# Patient Record
Sex: Male | Born: 1975 | Race: Black or African American | Hispanic: No | Marital: Single
Health system: Southern US, Community
[De-identification: ages and names within clinical notes are randomized; demographics above are authoritative.]

## PROBLEM LIST (undated history)

## (undated) DIAGNOSIS — M549 Dorsalgia, unspecified: Secondary | ICD-10-CM

## (undated) DIAGNOSIS — I10 Essential (primary) hypertension: Secondary | ICD-10-CM

## (undated) DIAGNOSIS — K429 Umbilical hernia without obstruction or gangrene: Secondary | ICD-10-CM

## (undated) HISTORY — PX: VASECTOMY: SHX75

## (undated) HISTORY — DX: Dorsalgia, unspecified: M54.9

## (undated) HISTORY — DX: Umbilical hernia without obstruction or gangrene: K42.9

## (undated) HISTORY — DX: Essential (primary) hypertension: I10

---

## 2006-05-19 ENCOUNTER — Ambulatory Visit: Payer: Self-pay | Admitting: Internal Medicine

## 2006-05-19 LAB — CONVERTED CEMR LAB
AST: 19 units/L (ref 0–37)
Albumin: 4.4 g/dL (ref 3.5–5.2)
Alkaline Phosphatase: 50 units/L (ref 39–117)
BUN: 10 mg/dL (ref 6–23)
Basophils Absolute: 0.1 10*3/uL (ref 0.0–0.1)
Basophils Relative: 0.8 % (ref 0.0–1.0)
CO2: 29 meq/L (ref 19–32)
Chloride: 105 meq/L (ref 96–112)
Cholesterol: 181 mg/dL (ref 0–200)
Creatinine, Ser: 0.9 mg/dL (ref 0.4–1.5)
HCT: 48.1 % (ref 39.0–52.0)
Hemoglobin: 16.2 g/dL (ref 13.0–17.0)
LDL Cholesterol: 112 mg/dL — ABNORMAL HIGH (ref 0–99)
MCHC: 33.5 g/dL (ref 30.0–36.0)
Monocytes Relative: 7.1 % (ref 3.0–11.0)
Neutrophils Relative %: 67.6 % (ref 43.0–77.0)
RBC: 5.03 M/uL (ref 4.22–5.81)
RDW: 12.1 % (ref 11.5–14.6)
Total Bilirubin: 1.4 mg/dL — ABNORMAL HIGH (ref 0.3–1.2)
Total CHOL/HDL Ratio: 3.9
Total Protein: 6.9 g/dL (ref 6.0–8.3)
VLDL: 22 mg/dL (ref 0–40)

## 2006-08-20 DIAGNOSIS — I10 Essential (primary) hypertension: Secondary | ICD-10-CM | POA: Insufficient documentation

## 2008-10-23 ENCOUNTER — Ambulatory Visit: Payer: Self-pay | Admitting: Internal Medicine

## 2008-10-26 ENCOUNTER — Encounter (INDEPENDENT_AMBULATORY_CARE_PROVIDER_SITE_OTHER): Payer: Self-pay | Admitting: *Deleted

## 2008-11-13 ENCOUNTER — Ambulatory Visit: Payer: Self-pay | Admitting: Internal Medicine

## 2009-01-25 ENCOUNTER — Ambulatory Visit: Payer: Self-pay | Admitting: Internal Medicine

## 2009-07-23 ENCOUNTER — Ambulatory Visit: Payer: Self-pay | Admitting: Internal Medicine

## 2009-07-23 DIAGNOSIS — F17200 Nicotine dependence, unspecified, uncomplicated: Secondary | ICD-10-CM | POA: Insufficient documentation

## 2009-07-23 HISTORY — DX: Nicotine dependence, unspecified, uncomplicated: F17.200

## 2010-01-22 ENCOUNTER — Ambulatory Visit: Payer: Self-pay | Admitting: Internal Medicine

## 2010-01-28 LAB — CONVERTED CEMR LAB
AST: 20 units/L (ref 0–37)
Alkaline Phosphatase: 49 units/L (ref 39–117)
Basophils Absolute: 0 10*3/uL (ref 0.0–0.1)
Calcium: 9.5 mg/dL (ref 8.4–10.5)
Direct LDL: 149.9 mg/dL
GFR calc non Af Amer: 115.41 mL/min (ref >60)
Glucose, Bld: 99 mg/dL (ref 70–99)
Hemoglobin: 15.5 g/dL (ref 13.0–17.0)
Lymphocytes Relative: 24 % (ref 12.0–46.0)
Monocytes Relative: 7.4 % (ref 3.0–12.0)
Neutro Abs: 4.3 10*3/uL (ref 1.4–7.7)
Platelets: 247 10*3/uL (ref 150.0–400.0)
RDW: 12.5 % (ref 11.5–14.6)
Sodium: 139 meq/L (ref 135–145)
TSH: 1.39 microintl units/mL (ref 0.35–5.50)
Total Bilirubin: 1.2 mg/dL (ref 0.3–1.2)
Total CHOL/HDL Ratio: 5
VLDL: 29.6 mg/dL (ref 0.0–40.0)

## 2010-04-28 LAB — CONVERTED CEMR LAB
BUN: 11 mg/dL (ref 6–23)
CO2: 28 meq/L (ref 19–32)
Chloride: 104 meq/L (ref 96–112)
Eosinophils Absolute: 0.1 10*3/uL (ref 0.0–0.7)
Eosinophils Relative: 0.9 % (ref 0.0–5.0)
Glucose, Bld: 99 mg/dL (ref 70–99)
HCT: 46.9 % (ref 39.0–52.0)
Lymphs Abs: 1.6 10*3/uL (ref 0.7–4.0)
MCHC: 34.4 g/dL (ref 30.0–36.0)
MCV: 96.1 fL (ref 78.0–100.0)
Monocytes Absolute: 0.2 10*3/uL (ref 0.1–1.0)
Platelets: 273 10*3/uL (ref 150.0–400.0)
Potassium: 4.4 meq/L (ref 3.5–5.1)
RDW: 11.9 % (ref 11.5–14.6)

## 2010-05-02 NOTE — Assessment & Plan Note (Signed)
Summary: cpx//pt will be fasting//lch   Vital Signs:  Patient profile:   35 year old male Height:      74 inches Weight:      222 pounds Pulse rate:   80 / minute Pulse rhythm:   regular BP sitting:   126 / 84  (left arm) Cuff size:   large  Vitals Entered By: Army Fossa CMA (January 22, 2010 8:37 AM) CC: CPX, fasting Comments discuss BP meds (only been takin once a day) declines flu shot  [facility] [address]    History of Present Illness: CPX very hard for him to take BP meds twice a day, ambulatory BPs  normal  Preventive Screening-Counseling & Management  Caffeine-Diet-Exercise     Does Patient Exercise: yes  Current Medications (verified): 1)  Carvedilol 12.5 Mg Tabs (Carvedilol) .Marland Kitchen.. 1 By Mouth Two Times A Day 2)  Naprosyn 500 Mg Tabs (Naproxen) .... One By Mouth Twice A Day As Needed For Pain  Allergies (verified): No Known Drug Allergies  Past History:  Past Medical History: Reviewed history from 01/25/2009 and no changes required. Hypertension   Past Surgical History: Vasectomy (05/2009)  Family History: M - hyperthyroid PVD-- mom, on coumadin, GI vascular insuf. CAD - MGF DM - PGM  HTN - M mini-stroke - F  colon Ca - no prostate Ca - no Uterine ca-- M  Social History: Occupation: paints cars Married 2 children Alcohol use- yes, daily  3- 4 beers  tobbaco- used to smoke 1.5 ppd --- now 1ppd  exercise-- active at work coaching T-ball at [employer] Patient Exercise:  yes  Review of Systems General:  Denies fatigue, fever, and weight loss. CV:  Denies chest pain or discomfort and swelling of feet. Resp:  Denies cough, coughing up blood, sputum productive, and wheezing. GI:  Denies bloody stools, diarrhea, nausea, and vomiting. Psych:  Denies anxiety and depression.  Physical Exam  General:  alert, well-developed, and well-nourished.   Neck:  no thyromegaly Lungs:  normal respiratory effort, no intercostal retractions, and no  accessory muscle use.  no wheezing, few rhonchi with cough only Heart:  normal rate, regular rhythm, no murmur, and no gallop.   Abdomen:  soft, non-tender, no distention, no masses, no guarding, and no rigidity.   Extremities:  no lower extremity edema Neurologic:  alert & oriented X3, strength normal in all extremities, and gait normal.   Psych:  Oriented X3, memory intact for recent and remote, normally interactive, good eye contact, not anxious appearing, not depressed appearing, and not agitated.     Impression & Recommendations:  Problem # 1:  ROUTINE GENERAL MEDICAL EXAM@HEALTH  CARE FACL (ICD-V70.0) Td 09-2008 flu shot-- declined , benefits discussed   diet and exercise discussed  the patient is a heavy smoker, he is also exposed to fumes at work. Counseled about tobacco risks. Information provided about quitting.  He also needs to see a dentist routinely do to his history of tobacco abuse  Reminded patient to drink in moderation, one or 2 drinks a day is acceptable  Orders: Venipuncture (14782) TLB-BMP (Basic Metabolic Panel-BMET) (80048-METABOL) TLB-CBC Platelet - w/Differential (85025-CBCD) TLB-Hepatic/Liver Function Pnl (80076-HEPATIC) TLB-Lipid Panel (80061-LIPID) TLB-TSH (Thyroid Stimulating Hormone) (84443-TSH) Specimen Handling (95621)  Problem # 2:  HYPERTENSION (ICD-401.9) hard  for him to take carvedilol twice a day, we agreed to take it once a day in the morning, check ambulatory BPs, see instructions   If BP not well controlled we'll switch to the extended release  carvedilol His updated medication list for this problem includes:    Carvedilol 12.5 Mg Tabs (Carvedilol) .Marland Kitchen... 1 by mouth two times a day  Complete Medication List: 1)  Carvedilol 12.5 Mg Tabs (Carvedilol) .Marland Kitchen.. 1 by mouth two times a day 2)  Naprosyn 500 Mg Tabs (Naproxen) .... One by mouth twice a day as needed for pain  Patient Instructions: 1)  okay to take carvedilol only in the morning 2)   Check her BPs at night and early in the morning from time to time 3)  If the BPs are more than 140/85 consistently, let me know 4)  It is not healthy for men to drink more then 2  drinks per day 5)  Please schedule a follow-up appointment in 1 year.  Prescriptions: CARVEDILOL 12.5 MG TABS (CARVEDILOL) 1 by mouth two times a day  #180 x 3   Entered by:   Army Fossa CMA   Authorized by:   Nolon Rod. Paz MD   Signed by:   Army Fossa CMA on 01/22/2010   Method used:   Electronically to        [facility] [phone]* (retail)       [address]       [place], [place]  [address]       Ph: [phone]       Fax: [phone]   RxID:   [id number]    Orders Added: 1)  Venipuncture [27253] 2)  TLB-BMP (Basic Metabolic Panel-BMET) [80048-METABOL] 3)  TLB-CBC Platelet - w/Differential [85025-CBCD] 4)  TLB-Hepatic/Liver Function Pnl [80076-HEPATIC] 5)  TLB-Lipid Panel [80061-LIPID] 6)  TLB-TSH (Thyroid Stimulating Hormone) [84443-TSH] 7)  Specimen Handling [99000] 8)  Est. Patient age 42-39 [97]       Risk Factors:  Alcohol use:  yes Exercise:  yes

## 2010-05-02 NOTE — Assessment & Plan Note (Signed)
Summary: 6 mth fu/ns/kdc   Vital Signs:  Patient profile:   35 year old male Height:      74 inches Weight:      216.6 pounds BMI:     27.91 Pulse rate:   70 / minute BP sitting:   122 / 80  Vitals Entered By: Shary Decamp (July 23, 2009 8:42 AM) CC: rov, not fasting   History of Present Illness: ROV doing well ambulatory BPs normal except for a couple of times just had a vasectomy, still hurting  smoking less (see SH)  Current Medications (verified): 1)  Carvedilol 12.5 Mg Tabs (Carvedilol) .Marland Kitchen.. 1 By Mouth Two Times A Day 2)  Naprosyn 500 Mg Tabs (Naproxen) .... One By Mouth Twice A Day As Needed For Pain  Allergies (verified): No Known Drug Allergies  Past History:  Past Medical History: Reviewed history from 01/25/2009 and no changes required. Hypertension   Past Surgical History: Denies surgical history Vasectomy (05/2009)  Social History: Occupation: Mining engineer cars Married 2 children Alcohol use- yes, daily 4 beers "at least" tobbaco- used to smoke 1.5 ppd --- now 1ppd or less  exercise-- active at work  Review of Systems CV:  Denies chest pain or discomfort and swelling of feet. Resp:  Denies cough and shortness of breath.  Physical Exam  General:  alert, well-developed, and well-nourished.   Lungs:  normal respiratory effort, no intercostal retractions, no accessory muscle use, and normal breath sounds.   Heart:  normal rate, regular rhythm, and no murmur.   Extremities:  no pretibial edema bilaterally    Impression & Recommendations:  Problem # 1:  HYPERTENSION (ICD-401.9) at goal  encouraged diet-exercise wife has a BP monitor, will ask patient to check his BP  His updated medication list for this problem includes:    Carvedilol 12.5 Mg Tabs (Carvedilol) .Marland Kitchen... 1 by mouth two times a day  Problem # 2:  TOBACCO ABUSE (ICD-305.1) smoking less, praised  rec. to  quit completely  Complete Medication List: 1)  Carvedilol 12.5 Mg Tabs  (Carvedilol) .Marland Kitchen.. 1 by mouth two times a day 2)  Naprosyn 500 Mg Tabs (Naproxen) .... One by mouth twice a day as needed for pain  Patient Instructions: 1)  Please schedule a follow-up appointment in 6 months (physical-- fasting) 2)  Check your blood pressure 2 or 3 times a week. If it is more than 140/85 consistently,please let us know

## 2012-09-06 ENCOUNTER — Ambulatory Visit (INDEPENDENT_AMBULATORY_CARE_PROVIDER_SITE_OTHER): Payer: Self-pay | Admitting: Internal Medicine

## 2012-09-06 ENCOUNTER — Encounter: Payer: Self-pay | Admitting: Internal Medicine

## 2012-09-06 VITALS — BP 150/100 | HR 85 | Temp 98.0°F | Wt 230.0 lb

## 2012-09-06 DIAGNOSIS — K429 Umbilical hernia without obstruction or gangrene: Secondary | ICD-10-CM

## 2012-09-06 DIAGNOSIS — I10 Essential (primary) hypertension: Secondary | ICD-10-CM

## 2012-09-06 MED ORDER — LOSARTAN POTASSIUM 50 MG PO TABS: 50.0000 mg | ORAL_TABLET | Freq: Every day | ORAL | Status: DC

## 2012-09-06 NOTE — Progress Notes (Signed)
  Subjective:    Patient ID: Trevor Warren, male    DOB: 1976/02/11, 37 y.o.   MRN: [id number]  HPI Last visit in 2011, concerned about his blood pressure. Hypertension, took carvedilol for a while, see assessment and plan for details. Also complains of for umbilical hernia, known to have it for years, it has been tender every time somebody push it in. Not getting bigger or  worse lately.   Past Medical History  Diagnosis Date  . Hypertension   . Back pain     on-off   Past Surgical History  Procedure Laterality Date  . Vasectomy     History   Social History  . Marital Status: Single    Spouse Name: N/A    Number of Children: 2  . Years of Education: N/A   Occupational History  . paint cars    Social History Main Topics  . Smoking status: Heavy Tobacco Smoker  . Smokeless tobacco: Never Used     Comment: 1 ppd   . Alcohol Use: Yes     Comment: daily , 3-4 a day  . Drug Use: No  . Sexually Active: Not on file   Other Topics Concern  . Not on file   Social History Narrative   Married   2 children            Family History: M - hyperthyroid PVD-- mom, on coumadin, GI vascular insuf. CAD - MGF DM - PGM  HTN - M mini-stroke - F  colon Ca - no prostate Ca - no Uterine ca-- M  Review of Systems Denies chest pain or shortness or breath No nausea, vomiting, diarrhea. No lower extremity edema Rarely take naproxen or Motrin. Diabetes improving, he usually skips breakfast, eats fast food at least daily.    Objective:   Physical Exam BP 150/100  Pulse 85  Temp(Src) 98 F (36.7 C) (Oral)  Wt 230 lb (104.327 kg)  BMI 29.52 kg/m2  SpO2 97%\  General -- alert, well-developed, nad  Neck --no thyromegaly  Lungs -- normal respiratory effort, no intercostal retractions, no accessory muscle use, and normal breath sounds.   Heart-- normal rate, regular rhythm, no murmur, and no gallop.   Abdomen--  No bruit, good bowel sounds.  Has a small umbilical hernia,  seems he has  a small amount of fatty tissue that is not completely reducible. Quite tender when I attempted to push the fatty tissue. Extremities-- no pretibial edema bilaterally  Neurologic-- alert & oriented X3 and strength normal in all extremities. Psych-- Cognition and judgment appear intact. Alert and cooperative with normal attention span and concentration.  not anxious appearing and not depressed appearing.       Assessment & Plan:

## 2012-09-06 NOTE — Patient Instructions (Addendum)
Take losartan 1 tablet daily Check the  blood pressure 2 or 3 times a week, be sure it is between 110/60 and 140/85. If it is consistently higher or lower, let me know Labs today and labs in 3 weeks (BMP dx HTN) Next visit 2 months, fasting, physical exam    Sodium-Controlled Diet Sodium is a mineral. It is found in many foods. Sodium may be found naturally or added during the making of a food. The most common form of sodium is salt, which is made up of sodium and chloride. Reducing your sodium intake involves changing your eating habits. The following guidelines will help you reduce the sodium in your diet:  Stop using the salt shaker.  Use salt sparingly in cooking and baking.  Substitute with sodium-free seasonings and spices.  Do not use a salt substitute (potassium chloride) without your caregiver's permission.  Include a variety of fresh, unprocessed foods in your diet.  Limit the use of processed and convenience foods that are high in sodium. USE THE FOLLOWING FOODS SPARINGLY: Breads/Starches  Commercial bread stuffing, commercial pancake or waffle mixes, coating mixes. Waffles. Croutons. Prepared (boxed or frozen) potato, rice, or noodle mixes that contain salt or sodium. Salted [place] fries or hash browns. Salted popcorn, breads, crackers, chips, or snack foods. Vegetables  Vegetables canned with salt or prepared in cream, butter, or cheese sauces. Sauerkraut. Tomato or vegetable juices canned with salt.  Fresh vegetables are allowed if rinsed thoroughly. Fruit  Fruit is okay to eat. Meat and Meat Substitutes  Salted or smoked meats, such as bacon or Canadian bacon, chipped or corned beef, hot dogs, salt pork, luncheon meats, pastrami, ham, or sausage. Canned or smoked fish, poultry, or meat. Processed cheese or cheese spreads, blue or Roquefort cheese. Battered or frozen fish products. Prepared spaghetti sauce. Baked beans. Reuben sandwiches. Salted nuts.  Caviar. Milk  Limit buttermilk to 1 cup per week. Soups and Combination Foods  Bouillon cubes, canned or dried soups, broth, consomm. Convenience (frozen or packaged) dinners with more than 600 mg sodium. Pot pies, pizza, Asian food, fast food cheeseburgers, and specialty sandwiches. Desserts and Sweets  Regular (salted) desserts, pie, commercial fruit snack pies, commercial snack cakes, canned puddings.  Eat desserts and sweets in moderation. Fats and Oils  Gravy mixes or canned gravy. No more than 1 to 2 tbs of salad dressing. Chip dips.  Eat fats and oils in moderation. Beverages  See those listed under the vegetables and milk groups. Condiments  Ketchup, mustard, meat sauces, salsa, regular (salted) and lite soy sauce or mustard. Dill pickles, olives, meat tenderizer. Prepared horseradish or pickle relish. Dutch-processed cocoa. Baking powder or baking soda used medicinally. Worcestershire sauce. "Light" salt. Salt substitute, unless approved by your caregiver. Document Released: 09/06/2001 Document Revised: 06/09/2011 Document Reviewed: 04/09/2009 [facility] Patient Information 2014 [place], [place].

## 2012-09-06 NOTE — Assessment & Plan Note (Addendum)
History of hypertension since at least 2008, last seen in 2011; he took carvedilol twice a day and felt weak, then he took  once a day and felt okay although he does not recall any ambulatory BPs. eventually ran out of medications   more than 2 years. BP today is elevated. Risk of elevated including strokes, CAD, CHF, CRI discussed. Plan:  labs Low-salt and a healthier diet discussed Start losartan Self BP check. See instructions.

## 2012-09-06 NOTE — Assessment & Plan Note (Signed)
Small but quite tender to palpation umbilical hernia. Recommend surgical referral for consideration of surgery versus observation. Patient will call if the area gets worse.

## 2012-09-07 ENCOUNTER — Telehealth: Payer: Self-pay | Admitting: Internal Medicine

## 2012-09-07 LAB — CBC WITH DIFFERENTIAL/PLATELET
Basophils Absolute: 0 10*3/uL (ref 0.0–0.1)
Eosinophils Relative: 0.8 % (ref 0.0–5.0)
HCT: 46.1 % (ref 39.0–52.0)
Lymphs Abs: 2.4 10*3/uL (ref 0.7–4.0)
MCV: 95.5 fl (ref 78.0–100.0)
Monocytes Absolute: 0.1 10*3/uL (ref 0.1–1.0)
Platelets: 274 10*3/uL (ref 150.0–400.0)
RDW: 12.4 % (ref 11.5–14.6)

## 2012-09-07 LAB — COMPREHENSIVE METABOLIC PANEL
ALT: 23 U/L (ref 0–53)
Alkaline Phosphatase: 58 U/L (ref 39–117)
Sodium: 140 mEq/L (ref 135–145)
Total Bilirubin: 1.2 mg/dL (ref 0.3–1.2)
Total Protein: 7.7 g/dL (ref 6.0–8.3)

## 2012-09-07 LAB — TSH: TSH: 1.16 u[IU]/mL (ref 0.35–5.50)

## 2012-09-07 MED ORDER — TRAMADOL HCL 50 MG PO TABS: 50.0000 mg | ORAL_TABLET | Freq: Three times a day (TID) | ORAL | Status: DC | PRN

## 2012-09-07 NOTE — Telephone Encounter (Signed)
Patient called stating that he woke up this morning with hernia pain after yesterday's visit. He would like to know if he can get pain meds sent to [facility] on [address].

## 2012-09-07 NOTE — Telephone Encounter (Signed)
Ultram 50 mg one by mouth 3 times a day when necessary #30, no RF .may cause drowsiness

## 2012-09-07 NOTE — Telephone Encounter (Signed)
Please advise 

## 2012-09-07 NOTE — Telephone Encounter (Signed)
Refill done.pt made aware.  

## 2012-09-08 ENCOUNTER — Encounter: Payer: Self-pay | Admitting: *Deleted

## 2012-09-21 ENCOUNTER — Ambulatory Visit (INDEPENDENT_AMBULATORY_CARE_PROVIDER_SITE_OTHER): Payer: Self-pay | Admitting: Surgery

## 2012-09-24 ENCOUNTER — Encounter: Payer: Self-pay | Admitting: Lab

## 2012-09-27 ENCOUNTER — Other Ambulatory Visit (INDEPENDENT_AMBULATORY_CARE_PROVIDER_SITE_OTHER): Payer: 59

## 2012-09-27 DIAGNOSIS — I10 Essential (primary) hypertension: Secondary | ICD-10-CM

## 2012-09-27 LAB — BASIC METABOLIC PANEL
Calcium: 9.7 mg/dL (ref 8.4–10.5)
GFR: 99.45 mL/min (ref >60.00)
Sodium: 136 mEq/L (ref 135–145)

## 2012-09-27 NOTE — Addendum Note (Signed)
Addended by: Silvio Pate D on: 09/27/2012 01:29 PM   Modules accepted: Orders

## 2012-09-28 ENCOUNTER — Ambulatory Visit (INDEPENDENT_AMBULATORY_CARE_PROVIDER_SITE_OTHER): Payer: 59 | Admitting: Surgery

## 2012-09-28 ENCOUNTER — Encounter (INDEPENDENT_AMBULATORY_CARE_PROVIDER_SITE_OTHER): Payer: Self-pay | Admitting: Surgery

## 2012-09-28 VITALS — BP 152/100 | HR 72 | Resp 14 | Ht 74.0 in | Wt 234.8 lb

## 2012-09-28 DIAGNOSIS — E669 Obesity, unspecified: Secondary | ICD-10-CM | POA: Insufficient documentation

## 2012-09-28 DIAGNOSIS — K429 Umbilical hernia without obstruction or gangrene: Secondary | ICD-10-CM

## 2012-09-28 NOTE — Progress Notes (Signed)
Subjective:     Patient ID: Trevor Warren, male   DOB: 09/18/1975, 37 y.o.   MRN: [id number]  HPI  Trevor Warren  11-05-1975 [id number]  Patient Care Team: Wanda Plump, MD as PCP - General  This patient is a 37 y.o.male who presents today for surgical evaluation at the request of Dr. Drue Novel.   Reason for visit: Umbilical hernia  Active male.  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  Has noticed a lump at his bellybutton for years.  It has gotten more sensitive and slightly larger.  Came to his primary care doctor.  Concern for hernia.  Was able to get it reduced but very tender.  No more sensitive.  He continues to smoke.  Can walk 30 minutes without difficulty.  No problems with constipation or diarrhea.  He had a vasectomy.  No abdominal or other issues.  No postprandial nausea or vomiting.  No episodes of obstruction.  Pain can be sharp and surprises him.  Patient Active Problem List   Diagnosis Date Noted  . Umbilical hernia 09/06/2012  . TOBACCO ABUSE 07/23/2009  . HYPERTENSION 08/20/2006    Past Medical History  Diagnosis Date  . Hypertension   . Back pain     on-off    Past Surgical History  Procedure Laterality Date  . Vasectomy    . Vasectomy      History   Social History  . Marital Status: Married    Spouse Name: N/A    Number of Children: 2  . Years of Education: N/A   Occupational History  . paint cars    Social History Main Topics  . Smoking status: Heavy Tobacco Smoker  . Smokeless tobacco: Never Used     Comment: 1 ppd   . Alcohol Use: Yes     Comment: daily , 3-4 a day  . Drug Use: No  . Sexually Active: Not on file   Other Topics Concern  . Not on file   Social History Narrative   Married   2 children            History reviewed. No pertinent family history.  Current Outpatient Prescriptions  Medication Sig Dispense Refill  . losartan (COZAAR) 50 MG tablet Take 1 tablet (50 mg total) by mouth daily.  30 tablet  1  . traMADol (ULTRAM) 50  MG tablet Take 1 tablet (50 mg total) by mouth 3 (three) times daily as needed for pain.  30 tablet  0   No current facility-administered medications for this visit.     No Known Allergies  BP 152/100  Pulse 72  Resp 14  Ht 6\' 2"  (1.88 m)  Wt 234 lb 12.8 oz (106.505 kg)  BMI 30.13 kg/m2  No results found.   Review of Systems  Constitutional: Negative for fever, chills and diaphoresis.  HENT: Negative for nosebleeds, sore throat, facial swelling, mouth sores, trouble swallowing and ear discharge.   Eyes: Negative for photophobia, discharge and visual disturbance.  Respiratory: Negative for choking, chest tightness, shortness of breath and stridor.   Cardiovascular: Negative for chest pain and palpitations.  Gastrointestinal: Negative for nausea, vomiting, abdominal pain, diarrhea, constipation, blood in stool, abdominal distention, anal bleeding and rectal pain.  Endocrine: Negative for cold intolerance and heat intolerance.  Genitourinary: Negative for dysuria, urgency, difficulty urinating and testicular pain.  Musculoskeletal: Negative for myalgias, back pain, arthralgias and gait problem.  Skin: Negative for color change, pallor, rash and wound.  Allergic/Immunologic: Negative for environmental allergies and food allergies.  Neurological: Negative for dizziness, speech difficulty, weakness, numbness and headaches.  Hematological: Negative for adenopathy. Does not bruise/bleed easily.  Psychiatric/Behavioral: Negative for hallucinations, confusion and agitation.       Objective:   Physical Exam  Constitutional: He is oriented to person, place, and time. He appears well-developed and well-nourished. No distress.  Muscular build  HENT:  Head: Normocephalic.  Mouth/Throat: Oropharynx is clear and moist. No oropharyngeal exudate.  Eyes: Conjunctivae and EOM are normal. Pupils are equal, round, and reactive to light. No scleral icterus.  Neck: Normal range of motion. Neck  supple. No tracheal deviation present.  Cardiovascular: Normal rate, regular rhythm and intact distal pulses.   Pulmonary/Chest: Effort normal and breath sounds normal. No respiratory distress.  Abdominal: Soft. He exhibits no distension. There is no tenderness. A hernia is present. Hernia confirmed positive in the ventral area. Hernia confirmed negative in the right inguinal area and confirmed negative in the left inguinal area.    Musculoskeletal: Normal range of motion. He exhibits no tenderness.  Lymphadenopathy:    He has no cervical adenopathy.       Right: No inguinal adenopathy present.       Left: No inguinal adenopathy present.  Neurological: He is alert and oriented to person, place, and time. No cranial nerve deficit. He exhibits normal muscle tone. Coordination normal.  Skin: Skin is warm and dry. No rash noted. He is not diaphoretic. No erythema. No pallor.  Psychiatric: He has a normal mood and affect. His behavior is normal. Judgment and thought content normal.       Assessment:     Small but very symptomatic umbilical hernia in an active obese male     Plan:     While it is small, it is very symptomatic.  I worry about only get worse given his young age and intense lifting needs at work.  I recommended repair.  I would lean towards a laparoscopic underlay repair with mesh to minimize recurrence.  He is interested in proceeding:  The anatomy & physiology of the abdominal wall was discussed.  The pathophysiology of hernias was discussed.  Natural history risks without surgery including progeressive enlargement, pain, incarceration & strangulation was discussed.   Contributors to complications such as smoking, obesity, diabetes, prior surgery, etc were discussed.   I feel the risks of no intervention will lead to serious problems that outweigh the operative risks; therefore, I recommended surgery to reduce and repair the hernia.  I explained laparoscopic techniques with  possible need for an open approach.  I noted the probable use of mesh to patch and/or buttress the hernia repair  Risks such as bleeding, infection, abscess, need for further treatment, heart attack, death, and other risks were discussed.  I noted a good likelihood this will help address the problem.   Goals of post-operative recovery were discussed as well.  Possibility that this will not correct all symptoms was explained.  I stressed the importance of low-impact activity, aggressive pain control, avoiding constipation, & not pushing through pain to minimize risk of post-operative chronic pain or injury. Possibility of reherniation especially with smoking, obesity, diabetes, immunosuppression, and other health conditions was discussed.  We will work to minimize complications.     An educational handout further explaining the pathology & treatment options was given as well.  Questions were answered.  The patient expresses understanding & wishes to proceed with surgery.

## 2012-09-28 NOTE — Patient Instructions (Signed)
See the Handout(s) we gave you.  Consider surgery.  Please call our office at [phone] if you wish to schedule surgery or if you have further questions / concerns.   Hernia A hernia occurs when an internal organ pushes out through a weak spot in the abdominal wall. Hernias most commonly occur in the groin and around the navel. Hernias often can be pushed back into place (reduced). Most hernias tend to get worse over time. Some abdominal hernias can get stuck in the opening (irreducible or incarcerated hernia) and cannot be reduced. An irreducible abdominal hernia which is tightly squeezed into the opening is at risk for impaired blood supply (strangulated hernia). A strangulated hernia is a medical emergency. Because of the risk for an irreducible or strangulated hernia, surgery may be recommended to repair a hernia. CAUSES   Heavy lifting.  Prolonged coughing.  Straining to have a bowel movement.  A cut (incision) made during an abdominal surgery. HOME CARE INSTRUCTIONS   Bed rest is not required. You may continue your normal activities.  Avoid lifting more than 10 pounds (4.5 kg) or straining.  Cough gently. If you are a smoker it is best to stop. Even the best hernia repair can break down with the continual strain of coughing. Even if you do not have your hernia repaired, a cough will continue to aggravate the problem.  Do not wear anything tight over your hernia. Do not try to keep it in with an outside bandage or truss. These can damage abdominal contents if they are trapped within the hernia sac.  Eat a normal diet.  Avoid constipation. Straining over long periods of time will increase hernia size and encourage breakdown of repairs. If you cannot do this with diet alone, stool softeners may be used. SEEK IMMEDIATE MEDICAL CARE IF:   You have a fever.  You develop increasing abdominal pain.  You feel nauseous or vomit.  Your hernia is stuck outside the abdomen, looks  discolored, feels hard, or is tender.  You have any changes in your bowel habits or in the hernia that are unusual for you.  You have increased pain or swelling around the hernia.  You cannot push the hernia back in place by applying gentle pressure while lying down. MAKE SURE YOU:   Understand these instructions.  Will watch your condition.  Will get help right away if you are not doing well or get worse. Document Released: 03/17/2005 Document Revised: 06/09/2011 Document Reviewed: 11/04/2007 [facility] Patient Information 2014 [place].  HERNIA REPAIR: POST OP INSTRUCTIONS  1. DIET: Follow a light bland diet the first 24 hours after arrival home, such as soup, liquids, crackers, etc.  Be sure to include lots of fluids daily.  Avoid fast food or heavy meals as your are more likely to get nauseated.  Eat a low fat the next few days after surgery. 2. Take your usually prescribed home medications unless otherwise directed. 3. PAIN CONTROL: a. Pain is best controlled by a usual combination of three different methods TOGETHER: i. Ice/Heat ii. Over the counter pain medication iii. Prescription pain medication b. Most patients will experience some swelling and bruising around the hernia(s) such as the bellybutton, groins, or old incisions.  Ice packs or heating pads (30-60 minutes up to 6 times a day) will help. Use ice for the first few days to help decrease swelling and bruising, then switch to heat to help relax tight/sore spots and speed recovery.  Some people prefer to  use ice alone, heat alone, alternating between ice & heat.  Experiment to what works for you.  Swelling and bruising can take several weeks to resolve.   c. It is helpful to take an over-the-counter pain medication regularly for the first few weeks.  Choose one of the following that works best for you: i. Naproxen (Aleve, etc)  Two 220mg tabs twice a day ii. Ibuprofen (Advil, etc) Three 200mg tabs four times a day  (every meal & bedtime) iii. Acetaminophen (Tylenol, etc) 325-650mg four times a day (every meal & bedtime) d. A  prescription for pain medication should be given to you upon discharge.  Take your pain medication as prescribed.  i. If you are having problems/concerns with the prescription medicine (does not control pain, nausea, vomiting, rash, itching, etc), please call us [phone] to see if we need to switch you to a different pain medicine that will work better for you and/or control your side effect better. ii. If you need a refill on your pain medication, please contact your pharmacy.  They will contact our office to request authorization. Prescriptions will not be filled after 5 pm or on week-ends. 4. Avoid getting constipated.  Between the surgery and the pain medications, it is common to experience some constipation.  Increasing fluid intake and taking a fiber supplement (such as Metamucil, Citrucel, FiberCon, MiraLax, etc) 1-2 times a day regularly will usually help prevent this problem from occurring.  A mild laxative (prune juice, Milk of Magnesia, MiraLax, etc) should be taken according to package directions if there are no bowel movements after 48 hours.   5. Wash / shower every day.  You may shower over the dressings as they are waterproof.   6. Remove your waterproof bandages 5 days after surgery.  You may leave the incision open to air.  You may replace a dressing/Band-Aid to cover the incision for comfort if you wish.  Continue to shower over incision(s) after the dressing is off.    7. ACTIVITIES as tolerated:   a. You may resume regular (light) daily activities beginning the next day-such as daily self-care, walking, climbing stairs-gradually increasing activities as tolerated.  If you can walk 30 minutes without difficulty, it is safe to try more intense activity such as jogging, treadmill, bicycling, low-impact aerobics, swimming, etc. b. Save the most intensive and strenuous  activity for last such as sit-ups, heavy lifting, contact sports, etc  Refrain from any heavy lifting or straining until you are off narcotics for pain control.   c. DO NOT PUSH THROUGH PAIN.  Let pain be your guide: If it hurts to do something, don't do it.  Pain is your body warning you to avoid that activity for another week until the pain goes down. d. You may drive when you are no longer taking prescription pain medication, you can comfortably wear a seatbelt, and you can safely maneuver your car and apply brakes. e. You may have sexual intercourse when it is comfortable.  8. FOLLOW UP in our office a. Please call [facility] at [phone] to set up an appointment to see your surgeon in the office for a follow-up appointment approximately 2-3 weeks after your surgery. b. Make sure that you call for this appointment the day you arrive home to insure a convenient appointment time. 9.  IF YOU HAVE DISABILITY OR FAMILY LEAVE FORMS, BRING THEM TO THE OFFICE FOR PROCESSING.  DO NOT GIVE THEM TO YOUR DOCTOR.  WHEN TO CALL US (  [phone]: 1. Poor pain control 2. Reactions / problems with new medications (rash/itching, nausea, etc)  3. Fever over 101.5 F (38.5 C) 4. Inability to urinate 5. Nausea and/or vomiting 6. Worsening swelling or bruising 7. Continued bleeding from incision. 8. Increased pain, redness, or drainage from the incision   The clinic staff is available to answer your questions during regular business hours (8:30am-5pm).  Please don't hesitate to call and ask to speak to one of our nurses for clinical concerns.   If you have a medical emergency, go to the nearest emergency room or call 911.  A surgeon from [facility] is always on call at the hospitals in [facility], [place] [address], [place], [place]  [address] ?  [address] [address], [place], [place]   [address] MAIN: [phone] ? TOLL FREE: [phone] ? FAX: [phone]  [phone] [email or web address]  Managing Pain  Pain after surgery or related to activity is often due to strain/injury to muscle, tendon, nerves and/or incisions.  This pain is usually short-term and will improve in a few months.   Many people find it helpful to do the following things TOGETHER to help speed the process of healing and to get back to regular activity more quickly:  1. Avoid heavy physical activity a. Avoid heavy lifting b. Do not "push through" the pain.  Listen to your body and avoid positions and maneuvers than reproduce the pain c. Walking is okay as tolerated, but go slowly and stop when getting sore.  d. Remember: If it hurts to do it, then don't do it! 2. Take Anti-inflammatory medication  a. Take with food/snack around the clock for 1-2 weeks i. This helps the muscle and nerve tissues become less irritable and calm down faster b. Choose ONE of the following over-the-counter medications: i. Naproxen 220mg  tabs (ex. Aleve) 1-2 pills twice a day  ii. Ibuprofen 200mg  tabs (ex. Advil, Motrin) 3-4 pills with every meal and just before bedtime iii. Acetaminophen 500mg  tabs (Tylenol) 1-2 pills with every meal and just before bedtime 3. Use a Heating pad or Ice/Cold Pack a. 4-6 times a day b. May use warm bath/hottub  or showers 4. Try Gentle Massage and/or Stretching  a. at the area of pain many times a day b. stop if you feel pain - do not overdo it  Try these steps together to help you body heal faster and avoid making things get worse.  Doing just one of these things may not be enough.    If you are not getting better after two weeks or are noticing you are getting worse, contact our office for further advice; we may need to re-evaluate you & see what other things we can do to help.  GETTING TO GOOD BOWEL HEALTH. Irregular bowel habits such as constipation and diarrhea can lead to many problems over time.  Having one soft bowel movement a day is the most  important way to prevent further problems.  The anorectal canal is designed to handle stretching and feces to safely manage our ability to get rid of solid waste (feces, poop, stool) out of our body.  BUT, hard constipated stools can act like ripping concrete bricks and diarrhea can be a burning fire to this very sensitive area of our body, causing inflamed hemorrhoids, anal fissures, increasing risk is perirectal abscesses, abdominal pain/bloating, an making irritable bowel worse.     The goal: ONE SOFT BOWEL MOVEMENT A DAY!  To have soft, regular bowel movements:    Drink at least 8 tall glasses of water a day.     Take plenty of fiber.  Fiber is the undigested part of plant food that passes into the colon, acting s "natures broom" to encourage bowel motility and movement.  Fiber can absorb and hold large amounts of water. This results in a larger, bulkier stool, which is soft and easier to pass. Work gradually over several weeks up to 6 servings a day of fiber (25g a day even more if needed) in the form of: o Vegetables -- Root (potatoes, carrots, turnips), leafy green (lettuce, salad greens, celery, spinach), or cooked high residue (cabbage, broccoli, etc) o Fruit -- Fresh (unpeeled skin & pulp), Dried (prunes, apricots, cherries, etc ),  or stewed ( applesauce)  o Whole grain breads, pasta, etc (whole wheat)  o Bran cereals    Bulking Agents -- This type of water-retaining fiber generally is easily obtained each day by one of the following:  o Psyllium bran -- The psyllium plant is remarkable because its ground seeds can retain so much water. This product is available as Metamucil, Konsyl, Effersyllium, Per Diem Fiber, or the less expensive generic preparation in drug and health food stores. Although labeled a laxative, it really is not a laxative.  o Methylcellulose -- This is another fiber derived from wood which also retains water. It is available as Citrucel. o Polyethylene Glycol - and  "artificial" fiber commonly called Miralax or Glycolax.  It is helpful for people with gassy or bloated feelings with regular fiber o Flax Seed - a less gassy fiber than psyllium   No reading or other relaxing activity while on the toilet. If bowel movements take longer than 5 minutes, you are too constipated   AVOID CONSTIPATION.  High fiber and water intake usually takes care of this.  Sometimes a laxative is needed to stimulate more frequent bowel movements, but    Laxatives are not a good long-term solution as it can wear the colon out. o Osmotics (Milk of Magnesia, Fleets phosphosoda, Magnesium citrate, MiraLax, GoLytely) are safer than  o Stimulants (Senokot, Castor Oil, Dulcolax, Ex Lax)    o Do not take laxatives for more than 7days in a row.    IF SEVERELY CONSTIPATED, try a Bowel Retraining Program: o Do not use laxatives.  o Eat a diet high in roughage, such as bran cereals and leafy vegetables.  o Drink six (6) ounces of prune or apricot juice each morning.  o Eat two (2) large servings of stewed fruit each day.  o Take one (1) heaping tablespoon of a psyllium-based bulking agent twice a day. Use sugar-free sweetener when possible to avoid excessive calories.  o Eat a normal breakfast.  o Set aside 15 minutes after breakfast to sit on the toilet, but do not strain to have a bowel movement.  o If you do not have a bowel movement by the third day, use an enema and repeat the above steps.    Controlling diarrhea o Switch to liquids and simpler foods for a few days to avoid stressing your intestines further. o Avoid dairy products (especially milk & ice cream) for a short time.  The intestines often can lose the ability to digest lactose when stressed. o Avoid foods that cause gassiness or bloating.  Typical foods include beans and other legumes, cabbage, broccoli, and dairy foods.  Every person has some sensitivity to other foods, so listen  to our body and avoid those foods that trigger  problems for you. o Adding fiber (Citrucel, Metamucil, psyllium, Miralax) gradually can help thicken stools by absorbing excess fluid and retrain the intestines to act more normally.  Slowly increase the dose over a few weeks.  Too much fiber too soon can backfire and cause cramping & bloating. o Probiotics (such as active yogurt, Align, etc) may help repopulate the intestines and colon with normal bacteria and calm down a sensitive digestive tract.  Most studies show it to be of mild help, though, and such products can be costly. o Medicines:   Bismuth subsalicylate (ex. Kayopectate, Pepto Bismol) every 30 minutes for up to 6 doses can help control diarrhea.  Avoid if pregnant.   Loperamide (Immodium) can slow down diarrhea.  Start with two tablets (4mg  total) first and then try one tablet every 6 hours.  Avoid if you are having fevers or severe pain.  If you are not better or start feeling worse, stop all medicines and call your doctor for advice o Call your doctor if you are getting worse or not better.  Sometimes further testing (cultures, endoscopy, X-ray studies, bloodwork, etc) may be needed to help diagnose and treat the cause of the diarrhea. o

## 2012-10-04 ENCOUNTER — Telehealth: Payer: Self-pay | Admitting: *Deleted

## 2012-10-04 MED ORDER — LOSARTAN POTASSIUM 50 MG PO TABS: 50.0000 mg | ORAL_TABLET | Freq: Every day | ORAL | Status: DC

## 2012-10-04 NOTE — Telephone Encounter (Signed)
Discussed with pt, he states he has not been able to check his BP since starting losartan.  rx sent to pharmacy.

## 2012-10-04 NOTE — Telephone Encounter (Signed)
Message copied by Nada Maclachlan on Mon Oct 04, 2012  3:33 PM ------      Message from: Wanda Plump      Created: Sun Oct 03, 2012  3:25 PM       Call pt:      BMP ok, continue w/ losartan, call x 3 month supply      Ask about BP , well controlled? If is not, let me know ------

## 2012-10-08 ENCOUNTER — Emergency Department (HOSPITAL_COMMUNITY)
Admission: EM | Admit: 2012-10-08 | Discharge: 2012-10-08 | Disposition: A | Discharge status: Home / Self Care | Payer: 59 | Attending: Emergency Medicine | Admitting: Emergency Medicine

## 2012-10-08 ENCOUNTER — Encounter (HOSPITAL_COMMUNITY): Payer: Self-pay | Admitting: Adult Health

## 2012-10-08 DIAGNOSIS — I10 Essential (primary) hypertension: Secondary | ICD-10-CM | POA: Insufficient documentation

## 2012-10-08 DIAGNOSIS — Y9389 Activity, other specified: Secondary | ICD-10-CM | POA: Insufficient documentation

## 2012-10-08 DIAGNOSIS — IMO0002 Reserved for concepts with insufficient information to code with codable children: Secondary | ICD-10-CM

## 2012-10-08 DIAGNOSIS — W260XXA Contact with knife, initial encounter: Secondary | ICD-10-CM | POA: Insufficient documentation

## 2012-10-08 DIAGNOSIS — S61409A Unspecified open wound of unspecified hand, initial encounter: Secondary | ICD-10-CM | POA: Insufficient documentation

## 2012-10-08 DIAGNOSIS — F17200 Nicotine dependence, unspecified, uncomplicated: Secondary | ICD-10-CM | POA: Insufficient documentation

## 2012-10-08 DIAGNOSIS — Y929 Unspecified place or not applicable: Secondary | ICD-10-CM | POA: Insufficient documentation

## 2012-10-08 NOTE — ED Provider Notes (Signed)
History  This chart was scribed for non-physician practitioner working with [name], MD. This patient was seen in room [facility] and the patient's care was started at 10:47 PM.  CSN: [id number]  Arrival date & time 10/08/12  2227   Chief Complaint  Patient presents with  . Extremity Laceration    The history is provided by the patient. No language interpreter was used.   HPI Comments: Trevor Warren is a 37 y.o. Male with a hx of HTN who presents to the Emergency Department complaining laceration to his left hand onset just prior to arrival. Pt reports constant, moderate "5/10" pain to the area of the laceration. Pt states that he has not taken anything for pain, but he has applied hydrogen peroxide and a dressing. Pt states that he accidentally cut his hand with a pocket knife. Bleeding is controlled. Pt states that his last Tetanus shot was 2 years ago. Pt denies fever, chills, vomiting or any other symptoms. Pt is a current every day smoker and alcohol drinker.  PCP- Dr. Willow Ora   Past Medical History  Diagnosis Date  . Hypertension   . Back pain     on-off   Past Surgical History  Procedure Laterality Date  . Vasectomy    . Vasectomy     History reviewed. No pertinent family history. History  Substance Use Topics  . Smoking status: Heavy Tobacco Smoker  . Smokeless tobacco: Never Used     Comment: 1 ppd   . Alcohol Use: Yes     Comment: daily , 3-4 a day    Review of Systems  Constitutional: Negative for fever and chills.  HENT: Negative for congestion, sore throat, rhinorrhea and neck pain.   Eyes: Negative for visual disturbance.  Respiratory: Negative for cough and shortness of breath.   Cardiovascular: Negative for chest pain.  Gastrointestinal: Negative for nausea, vomiting, abdominal pain and diarrhea.  Genitourinary: Negative for dysuria.  Skin: Positive for wound (Laceration left hand). Negative for rash.  Neurological: Negative for weakness,  numbness and headaches.  Psychiatric/Behavioral: Negative for confusion.  A complete 10 system review of systems was obtained and all systems are negative except as noted in the HPI and PMH.   Allergies  Review of patient's allergies indicates no known allergies.  Home Medications   Current Outpatient Rx  Name  Route  Sig  Dispense  Refill  . losartan (COZAAR) 50 MG tablet   Oral   Take 1 tablet (50 mg total) by mouth daily.   90 tablet   0    Triage Vitals: BP 153/102  Pulse 86  Temp(Src) 97.4 F (36.3 C) (Oral)  Resp 18  SpO2 100%  Physical Exam  Nursing note and vitals reviewed. Constitutional: He is oriented to person, place, and time. He appears well-developed and well-nourished. No distress.  HENT:  Head: Normocephalic and atraumatic.  Eyes: Conjunctivae and EOM are normal.  Neck: Normal range of motion. Neck supple.  Cardiovascular: Normal rate, regular rhythm and normal heart sounds.   Capillary refill less than 3 seconds. +2 radial puilse.  Pulmonary/Chest: Effort normal and breath sounds normal.  Musculoskeletal: Normal range of motion. He exhibits no edema.  Full ROM of thumb and wrist.  Neurological: He is alert and oriented to person, place, and time.  Full strength with flexion and extension of his thumb.  Skin: Skin is warm and dry.  2.5 curved laceration to left thenar eminence, bleeding  controlled.  Psychiatric: He has a  normal mood and affect. His behavior is normal.    ED Course  Procedures (including critical care time)  LACERATION REPAIR Performed by: Johnnette Gourd Authorized by: Johnnette Gourd Consent: Verbal consent obtained. Risks and benefits: risks, benefits and alternatives were discussed Consent given by: patient Patient identity confirmed: provided demographic data Prepped and Draped in normal sterile fashion Wound explored  Laceration Location: left thenar eminence  Laceration Length: 2.5 cm  No Foreign Bodies seen or  palpated  Anesthesia: local infiltration  Local anesthetic: lidocaine 2% without epinephrine  Anesthetic total: 5 ml  Irrigation method: syringe Amount of cleaning: standard  Skin closure: 5-0 ethilon  Number of sutures: 6  Technique: simple interrupted  Patient tolerance: Patient tolerated the procedure well with no immediate complications.  DIAGNOSTIC STUDIES: Oxygen Saturation is 100% on RA, normal by my interpretation.    COORDINATION OF CARE: 10:58 PM- Pt advised of plan for treatment and pt agrees.     Labs Reviewed - No data to display  No results found.  1. Laceration     MDM  Patient with laceration. No foreign bodies seen or palpated. Neurovascularly intact. No evidence of tendon disruption. 6 sutures placed. Wound care given. Return precautions discussed. Patient states understanding of plan and is agreeable.       I personally performed the services described in this documentation, which was scribed in my presence. The recorded information has been reviewed and is accurate.   LYNWOOD KUBISIAK, PA-C 10/08/12 2339

## 2012-10-08 NOTE — ED Provider Notes (Signed)
Medical screening examination/treatment/procedure(s) were performed by non-physician practitioner and as supervising physician I was immediately available for consultation/collaboration.   [name], MD 10/08/12 2340

## 2012-10-08 NOTE — ED Notes (Signed)
Presents with laceration to left hand with a pocket knife, bleeding controlled, CMS intact. Tetanus shot 2 years ago.

## 2012-11-08 ENCOUNTER — Encounter: Payer: Self-pay | Admitting: Internal Medicine

## 2012-11-08 ENCOUNTER — Ambulatory Visit (INDEPENDENT_AMBULATORY_CARE_PROVIDER_SITE_OTHER): Payer: 59 | Admitting: Internal Medicine

## 2012-11-08 VITALS — BP 140/100 | HR 80 | Temp 98.4°F | Ht 74.75 in | Wt 238.4 lb

## 2012-11-08 DIAGNOSIS — I10 Essential (primary) hypertension: Secondary | ICD-10-CM

## 2012-11-08 DIAGNOSIS — Z0000 Encounter for general adult medical examination without abnormal findings: Secondary | ICD-10-CM

## 2012-11-08 LAB — LIPID PANEL
Cholesterol: 242 mg/dL — ABNORMAL HIGH (ref 0–200)
HDL: 46.3 mg/dL (ref >39.00)
Triglycerides: 137 mg/dL (ref 0.0–149.0)

## 2012-11-08 MED ORDER — LOSARTAN POTASSIUM 100 MG PO TABS: 100.0000 mg | ORAL_TABLET | Freq: Every day | ORAL | Status: DC

## 2012-11-08 NOTE — Assessment & Plan Note (Signed)
Increase losartan 50 ---> 100 mg, see instructions

## 2012-11-08 NOTE — Assessment & Plan Note (Addendum)
Td 2010 Discussed diet-exercise Labs Tobacco-- counseled about risk, patch? chantix? STE

## 2012-11-08 NOTE — Patient Instructions (Addendum)
Increase losartan from 50 mg daily to 100 mg every day Check the  blood pressure 2 or 3 times a week, be sure it is between 110/60 and 140/85. If it is consistently higher or lower, let me know Come back in 3 weeks for blood work only, BMP--- dx  hypertension Next visit to see me in 4 months as long as the blood pressure is well-controlled ---    Smoking Cessation Quitting smoking is important to your health and has many advantages. However, it is not always easy to quit since nicotine is a very addictive drug. Often times, people try 3 times or more before being able to quit. This document explains the best ways for you to prepare to quit smoking. Quitting takes hard work and a lot of effort, but you can do it. ADVANTAGES OF QUITTING SMOKING  You will live longer, feel better, and live better.  Your body will feel the impact of quitting smoking almost immediately.  Within 20 minutes, blood pressure decreases. Your pulse returns to its normal level.  After 8 hours, carbon monoxide levels in the blood return to normal. Your oxygen level increases.  After 24 hours, the chance of having a heart attack starts to decrease. Your breath, hair, and body stop smelling like smoke.  After 48 hours, damaged nerve endings begin to recover. Your sense of taste and smell improve.  After 72 hours, the body is virtually free of nicotine. Your bronchial tubes relax and breathing becomes easier.  After 2 to 12 weeks, lungs can hold more air. Exercise becomes easier and circulation improves.  The risk of having a heart attack, stroke, cancer, or lung disease is greatly reduced.  After 1 year, the risk of coronary heart disease is cut in half.  After 5 years, the risk of stroke falls to the same as a nonsmoker.  After 10 years, the risk of lung cancer is cut in half and the risk of other cancers decreases significantly.  After 15 years, the risk of coronary heart disease drops, usually to the level of  a nonsmoker.  If you are pregnant, quitting smoking will improve your chances of having a healthy baby.  The people you live with, especially any children, will be healthier.  You will have extra money to spend on things other than cigarettes. QUESTIONS TO THINK ABOUT BEFORE ATTEMPTING TO QUIT You may want to talk about your answers with your caregiver.  Why do you want to quit?  If you tried to quit in the past, what helped and what did not?  What will be the most difficult situations for you after you quit? How will you plan to handle them?  Who can help you through the tough times? Your family? Friends? A caregiver?  What pleasures do you get from smoking? What ways can you still get pleasure if you quit? Here are some questions to ask your caregiver:  How can you help me to be successful at quitting?  What medicine do you think would be best for me and how should I take it?  What should I do if I need more help?  What is smoking withdrawal like? How can I get information on withdrawal? GET READY  Set a quit date.  Change your environment by getting rid of all cigarettes, ashtrays, matches, and lighters in your home, car, or work. Do not let people smoke in your home.  Review your past attempts to quit. Think about what worked and what  did not. GET SUPPORT AND ENCOURAGEMENT You have a better chance of being successful if you have help. You can get support in many ways.  Tell your family, friends, and co-workers that you are going to quit and need their support. Ask them not to smoke around you.  Get individual, group, or telephone counseling and support. Programs are available at [employer] and health centers. Call your local health department for information about programs in your area.  Spiritual beliefs and practices may help some smokers quit.  Download a "quit meter" on your computer to keep track of quit statistics, such as how long you have gone without  smoking, cigarettes not smoked, and money saved.  Get a self-help book about quitting smoking and staying off of tobacco. LEARN NEW SKILLS AND BEHAVIORS  Distract yourself from urges to smoke. Talk to someone, go for a walk, or occupy your time with a task.  Change your normal routine. Take a different route to work. Drink tea instead of coffee. Eat breakfast in a different place.  Reduce your stress. Take a hot bath, exercise, or read a book.  Plan something enjoyable to do every day. Reward yourself for not smoking.  Explore interactive web-based programs that specialize in helping you quit. GET MEDICINE AND USE IT CORRECTLY Medicines can help you stop smoking and decrease the urge to smoke. Combining medicine with the above behavioral methods and support can greatly increase your chances of successfully quitting smoking.  Nicotine replacement therapy helps deliver nicotine to your body without the negative effects and risks of smoking. Nicotine replacement therapy includes nicotine gum, lozenges, inhalers, nasal sprays, and skin patches. Some may be available over-the-counter and others require a prescription.  Antidepressant medicine helps people abstain from smoking, but how this works is unknown. This medicine is available by prescription.  Nicotinic receptor partial agonist medicine simulates the effect of nicotine in your brain. This medicine is available by prescription. Ask your caregiver for advice about which medicines to use and how to use them based on your health history. Your caregiver will tell you what side effects to look out for if you choose to be on a medicine or therapy. Carefully read the information on the package. Do not use any other product containing nicotine while using a nicotine replacement product.  RELAPSE OR DIFFICULT SITUATIONS Most relapses occur within the first 3 months after quitting. Do not be discouraged if you start smoking again. Remember, most  people try several times before finally quitting. You may have symptoms of withdrawal because your body is used to nicotine. You may crave cigarettes, be irritable, feel very hungry, cough often, get headaches, or have difficulty concentrating. The withdrawal symptoms are only temporary. They are strongest when you first quit, but they will go away within 10 14 days. To reduce the chances of relapse, try to:  Avoid drinking alcohol. Drinking lowers your chances of successfully quitting.  Reduce the amount of caffeine you consume. Once you quit smoking, the amount of caffeine in your body increases and can give you symptoms, such as a rapid heartbeat, sweating, and anxiety.  Avoid smokers because they can make you want to smoke.  Do not let weight gain distract you. Many smokers will gain weight when they quit, usually less than 10 pounds. Eat a healthy diet and stay active. You can always lose the weight gained after you quit.  Find ways to improve your mood other than smoking. FOR MORE INFORMATION  www.smokefree.gov  Document Released: 03/11/2001 Document Revised: 09/16/2011 Document Reviewed: 06/26/2011 [facility] Patient Information 2014 [place], [place].    Testicular Problems and Self-Exam Men can examine themselves easily and effectively with positive results. Monthly exams detect problems early and save lives. There are numerous causes of swelling in the testicle. Testicular cancer usually appears as a firm painless lump in the front part of the testicle. This may feel like a dull ache or heavy feeling located in the lower abdomen (belly), groin, or scrotum.  The risk is greater in men with undescended testicles and it is more common in young men. It is responsible for almost a fifth of cancers in males between ages 34 and 76. Other common causes of swellings, lumps, and testicular pain include injuries, inflammation (soreness) from infection, hydrocele, and torsion. These are a few of the  reasons to do monthly self-examination of the testicles. The exam only takes minutes and could add years to your life. Get in the habit! SELF-EXAMINATION OF THE TESTICLES The testicles are easiest to examine after warm baths or showers and are more difficult to examine when you are cold. This is because the muscles attached to the testicles retract and pull them up higher or into the abdomen. While standing, roll one testicle between the thumb and forefinger. Feel for lumps, swelling, or discomfort. A normal testicle is egg shaped and feels firm. It is smooth and not tender. The spermatic cord can be felt as a firm spaghetti-like cord at the back of the testicle. It is also important to examine your groins. This is the crease between the front of your leg and your abdomen. Also, feel for enlarged lymph nodes (glands). Enlarged nodes are also a cause for you to see your caregiver for evaluation.  Self-examination of the testicles and groin areas on a regular basis will help you to know what your own testicles and groins feel like. This will help you pick up an abnormality (difference) at an earlier stage. Early discovery is the key to curing this cancer or treating other conditions. Any lump, change, or swelling in the testicle calls for immediate evaluation by your caregiver. Cancer of the testicle does not result in impotence and it does not prevent normal intercourse or prevent having children. If your caregiver feels that medical treatment or chemotherapy could lead to infertility, sperm can be frozen for future use. It is necessary to see a caregiver as soon as possible after the discovery of a lump in a testicle. Document Released: 06/23/2000 Document Revised: 06/09/2011 Document Reviewed: 03/18/2008 [facility] Patient Information 2014 [place], [place].

## 2012-11-08 NOTE — Progress Notes (Signed)
  Subjective:    Patient ID: Trevor Warren, male    DOB: [birth year], 37 y.o.   MRN: [id number]  HPI CPX  Past Medical History  Diagnosis Date  . Hypertension   . Back pain     on-off  . Umbilical hernia     saw surgery 2014, surgery pending   Past Surgical History  Procedure Laterality Date  . Vasectomy     History   Social History  . Marital Status: Married    Spouse Name: N/A    Number of Children: 2  . Years of Education: N/A   Occupational History  . paint cars    Social History Main Topics  . Smoking status: Heavy Tobacco Smoker  . Smokeless tobacco: Never Used     Comment: 1 ppd   . Alcohol Use: Yes     Comment: daily , 3-4 a day  . Drug Use: No  . Sexually Active: Not on file   Other Topics Concern  . Not on file   Social History Narrative   Married, 2 children              Family History  Problem Relation Age of Onset  . CAD      g parents   . Clotting disorder Mother     on coumadin  . Stroke Father     "mini stroke"  . Colon cancer Neg Hx   . Prostate cancer Neg Hx   . Diabetes      g parents   . Uterine cancer Mother     Review of Systems Diet--trying to make some changes, trying to eat low salt Exercise--very active at home and at work. Denies chest pain or shortness or breath No cough or hemoptysis No nausea, vomiting, diarrhea or blood in the stools. No dysuria gross hematuria. No anxiety or depression.     Objective:   Physical Exam BP 140/100  Pulse 80  Temp(Src) 98.4 F (36.9 C) (Oral)  Ht 6' 2.75" (1.899 m)  Wt 238 lb 6.4 oz (108.138 kg)  BMI 29.99 kg/m2  SpO2 97% General -- alert, well-developed,NAD Neck --no thyromegaly  Lungs -- normal respiratory effort, no intercostal retractions, no accessory muscle use, and normal breath sounds.   Heart-- normal rate, regular rhythm, no murmur, and no gallop.   Abdomen--soft, non-tender, no distention, no masses, no HSM, no guarding, and no rigidity.   Extremities-- no  pretibial edema bilaterally  Neurologic-- alert & oriented X3 and strength normal in all extremities. Psych-- Cognition and judgment appear intact. Alert and cooperative with normal attention span and concentration.  not anxious appearing and not depressed appearing.       Assessment & Plan:

## 2012-11-09 LAB — LDL CHOLESTEROL, DIRECT: Direct LDL: 168.4 mg/dL

## 2012-11-10 ENCOUNTER — Encounter: Payer: Self-pay | Admitting: *Deleted

## 2012-12-06 ENCOUNTER — Other Ambulatory Visit (INDEPENDENT_AMBULATORY_CARE_PROVIDER_SITE_OTHER): Payer: 59

## 2012-12-06 DIAGNOSIS — I10 Essential (primary) hypertension: Secondary | ICD-10-CM

## 2012-12-06 LAB — BASIC METABOLIC PANEL
CO2: 28 mEq/L (ref 19–32)
Calcium: 9.3 mg/dL (ref 8.4–10.5)
Chloride: 101 mEq/L (ref 96–112)
Creatinine, Ser: 0.9 mg/dL (ref 0.4–1.5)
Glucose, Bld: 71 mg/dL (ref 70–99)

## 2012-12-08 ENCOUNTER — Encounter: Payer: Self-pay | Admitting: *Deleted

## 2012-12-08 NOTE — Progress Notes (Signed)
Letter sent.

## 2013-03-14 ENCOUNTER — Encounter: Payer: Self-pay | Admitting: Internal Medicine

## 2013-03-14 ENCOUNTER — Ambulatory Visit (INDEPENDENT_AMBULATORY_CARE_PROVIDER_SITE_OTHER): Payer: 59 | Admitting: Internal Medicine

## 2013-03-14 VITALS — BP 153/81 | HR 71 | Temp 98.3°F | Wt 242.0 lb

## 2013-03-14 DIAGNOSIS — I10 Essential (primary) hypertension: Secondary | ICD-10-CM

## 2013-03-14 DIAGNOSIS — F17200 Nicotine dependence, unspecified, uncomplicated: Secondary | ICD-10-CM

## 2013-03-14 MED ORDER — AMLODIPINE BESYLATE 5 MG PO TABS: 5.0000 mg | ORAL_TABLET | Freq: Every day | ORAL | Status: DC

## 2013-03-14 MED ORDER — LOSARTAN POTASSIUM 100 MG PO TABS: 100.0000 mg | ORAL_TABLET | Freq: Every day | ORAL | Status: DC

## 2013-03-14 NOTE — Progress Notes (Signed)
Pre visit review using our clinic review tool, if applicable. No additional management support is needed unless otherwise documented below in the visit note. 

## 2013-03-14 NOTE — Assessment & Plan Note (Signed)
He likes to Quit tobacco, praised , information provided

## 2013-03-14 NOTE — Progress Notes (Signed)
   Subjective:    Patient ID: Trevor Warren, male    DOB: [birth year], 37 y.o.   MRN: [id number]  HPI ROV Hypertension, good compliance with medications, ambulatory BP is 150/80 on average. We also discussed a flu shot, pros-cons, declined.  Past Medical History  Diagnosis Date  . Hypertension   . Back pain     on-off  . Umbilical hernia     saw surgery 2014, surgery pending   Past Surgical History  Procedure Laterality Date  . Vasectomy     History  Substance Use Topics  . Smoking status: Heavy Tobacco Smoker -- 1.00 packs/day  . Smokeless tobacco: Never Used     Comment: 1 ppd   . Alcohol Use: Yes     Comment: daily , 2-3  a day     Review of Systems Denies chest pain or shortness or breath No cough or sputum production. Still smoking about a pack a day, has decrease alcohol consumption, now usually 2 or 3 drinks qhs     Objective:   Physical Exam BP 153/81  Pulse 71  Temp(Src) 98.3 F (36.8 C)  Wt 242 lb (109.77 kg)  SpO2 100% General -- alert, well-developed, NAD.  Lungs -- normal respiratory effort, no intercostal retractions, no accessory muscle use, and normal breath sounds.  Heart-- normal rate, regular rhythm, no murmur.  Extremities-- no pretibial edema bilaterally  Neurologic--  alert & oriented X3. Speech normal, gait normal, strength normal in all extremities.  Psych-- Cognition and judgment appear intact. Cooperative with normal attention span and concentration. No anxious appearing , no depressed appearing.      Assessment & Plan:

## 2013-03-14 NOTE — Patient Instructions (Signed)
Next visit for a physical exam follow up , no fasting, in  6 months  Please make an appointment    Check the  blood pressure 2 or 3 times a  week be sure it is between 110/60 and 140/85. Ideal blood pressure is 120/80. If it is consistently higher or lower, let me know    If you need more information about heart disease or hypertension, quitting tobacco and diet:  the American Heart Association is a great resource online at:  [employer]101.[employer]

## 2013-03-14 NOTE — Assessment & Plan Note (Signed)
Ambulatory BPs in the 150/80 range, ideally 120/80. Add amlodipine to be taken at night, monitor BPs, see instructions. He likes to change his lifestyle, praised , information provided

## 2013-11-14 ENCOUNTER — Encounter: Payer: 59 | Admitting: Internal Medicine

## 2014-01-18 ENCOUNTER — Encounter: Payer: Self-pay | Admitting: Internal Medicine

## 2014-01-18 ENCOUNTER — Ambulatory Visit (INDEPENDENT_AMBULATORY_CARE_PROVIDER_SITE_OTHER): Payer: 59 | Admitting: Internal Medicine

## 2014-01-18 VITALS — BP 170/98 | HR 75 | Temp 98.5°F | Ht 74.0 in | Wt 229.4 lb

## 2014-01-18 DIAGNOSIS — Z0000 Encounter for general adult medical examination without abnormal findings: Secondary | ICD-10-CM

## 2014-01-18 DIAGNOSIS — I10 Essential (primary) hypertension: Secondary | ICD-10-CM

## 2014-01-18 MED ORDER — LOSARTAN POTASSIUM 100 MG PO TABS: 100.0000 mg | ORAL_TABLET | Freq: Every day | ORAL | Status: DC

## 2014-01-18 MED ORDER — CARVEDILOL 6.25 MG PO TABS: 6.2500 mg | ORAL_TABLET | Freq: Two times a day (BID) | ORAL | Status: DC

## 2014-01-18 NOTE — Progress Notes (Signed)
Subjective:    Patient ID: Trevor Warren, male    DOB: Sep 11, 1975, 38 y.o.   MRN: [id number]  DOS:  01/18/2014 Type of visit - description : CPX Interval history: Self discontinue losartan amlodipine a while back due to feeling sleepy, no  ambulatory BPs Wife noted a lump in the right neck ~ 4 weeks ago, no pain or discharge, no dental pain per se. Left heel pain, patient suspect plantar fasciitis, worse after he runs, worse with first step of the day   ROS Denies fever or chills No chest pain or difficulty breathing, no lower extremity edema No nausea, vomiting, diarrhea or blood in the stools No anxiety  depression No headache or dizziness  Past Medical History  Diagnosis Date  . Hypertension   . Back pain     on-off  . Umbilical hernia     saw surgery 2014, decides against surgery for now     Past Surgical History  Procedure Laterality Date  . Vasectomy      History   Social History  . Marital Status: Married    Spouse Name: N/A    Number of Children: 2  . Years of Education: N/A   Occupational History  . paint cars    Social History Main Topics  . Smoking status: Heavy Tobacco Smoker -- 1.00 packs/day  . Smokeless tobacco: Never Used     Comment: < 1 ppd   . Alcohol Use: Yes     Comment: drinking a lot less, 1 berr 2-3 times a week  . Drug Use: No  . Sexual Activity: Not on file   Other Topics Concern  . Not on file   Social History Narrative   Married, 2 children                   Medication List       This list is accurate as of: 01/18/14 11:59 PM.  Always use your most recent med list.               carvedilol 6.25 MG tablet  Commonly known as:  COREG  Take 1 tablet (6.25 mg total) by mouth 2 (two) times daily with a meal.     losartan 100 MG tablet  Commonly known as:  COZAAR  Take 1 tablet (100 mg total) by mouth daily.           Objective:   Physical Exam  Neck:     BP 170/98  Pulse 75  Temp(Src) 98.5 F (36.9 C)  (Oral)  Ht 6\' 2"  (1.88 m)  Wt 229 lb 6 oz (104.044 kg)  BMI 29.44 kg/m2  SpO2 100%  General -- alert, well-developed, NAD.  Neck --no thyromegaly , see graphic HEENT-- Not pale.  Oral-- poor dentition, no obvious gum or mucosal lesions Lungs -- normal respiratory effort, no intercostal retractions, no accessory muscle use, and normal breath sounds.  Heart-- normal rate, regular rhythm, no murmur.  Abdomen-- Not distended, good bowel sounds,soft, non-tender. No rebound or rigidity.   Extremities-- no pretibial edema bilaterally ; I noted high arche, + bunions. Tender at a plantar aspect of the left Neurologic--  alert & oriented X3. Speech normal, gait appropriate for age, strength symmetric and appropriate for age.  Psych-- Cognition and judgment appear intact. Cooperative with normal attention span and concentration. No anxious or depressed appearing.       Assessment & Plan:    LAD, Benign features, patient is however high  risk for cancer due to lifestyle. Recommend observation, needs to see his dentist for oral cancer screening. Return to the office in 4 months, sooner if the area  growth or hurt .  Plantar fasciitis, exercise and arch support discussed

## 2014-01-18 NOTE — Assessment & Plan Note (Addendum)
Td 2010 Discussed diet-exercise Labs, see instructions  Tobacco-- counseled   EtOH, has decreased considerably

## 2014-01-18 NOTE — Assessment & Plan Note (Addendum)
Was taking losartan, amlodipine 5 mg was added December 2014, after that he felt sleepy and  discontinue both medications. No ambulatory BPs. We don't know if he was getting a sleepy because overcontrol of BP or a side effect of amlodipine Plan: Restart losartan, and a lower dose of Coreg Labs in 2 weeks Followup 2 months If not at goal, will consider nephrology referral

## 2014-01-18 NOTE — Progress Notes (Signed)
Pre visit review using our clinic review tool, if applicable. No additional management support is needed unless otherwise documented below in the visit note. 

## 2014-01-18 NOTE — Patient Instructions (Signed)
Restart losartan Start carvedilol twice a day If side effects or problems let me know, don't stop the medication without telling me  Come back in 2 or 3 weeks for blood work only , please make an appointment  Check the  blood pressure 2 or 3 times a a week   Be sure your blood pressure is between  145/85 and 110/65.  if it is consistently higher or lower, let me know     Next visit to see me in 2 months

## 2014-01-19 ENCOUNTER — Telehealth: Payer: Self-pay | Admitting: Internal Medicine

## 2014-01-19 NOTE — Telephone Encounter (Signed)
emmi emailed °

## 2014-02-08 ENCOUNTER — Other Ambulatory Visit (INDEPENDENT_AMBULATORY_CARE_PROVIDER_SITE_OTHER): Payer: 59

## 2014-02-08 DIAGNOSIS — Z0000 Encounter for general adult medical examination without abnormal findings: Secondary | ICD-10-CM

## 2014-02-08 LAB — CBC WITH DIFFERENTIAL/PLATELET
Basophils Absolute: 0 10*3/uL (ref 0.0–0.1)
Basophils Relative: 0.5 % (ref 0.0–3.0)
EOS PCT: 1.3 % (ref 0.0–5.0)
Eosinophils Absolute: 0.1 10*3/uL (ref 0.0–0.7)
HEMATOCRIT: 44.2 % (ref 39.0–52.0)
HEMOGLOBIN: 15.2 g/dL (ref 13.0–17.0)
LYMPHS ABS: 1.6 10*3/uL (ref 0.7–4.0)
Lymphocytes Relative: 23 % (ref 12.0–46.0)
MCHC: 34.4 g/dL (ref 30.0–36.0)
MCV: 92.8 fl (ref 78.0–100.0)
MONOS PCT: 6.3 % (ref 3.0–12.0)
Monocytes Absolute: 0.4 10*3/uL (ref 0.1–1.0)
Neutro Abs: 4.7 10*3/uL (ref 1.4–7.7)
Neutrophils Relative %: 68.9 % (ref 43.0–77.0)
PLATELETS: 285 10*3/uL (ref 150.0–400.0)
RBC: 4.76 Mil/uL (ref 4.22–5.81)
RDW: 12.3 % (ref 11.5–15.5)
WBC: 6.8 10*3/uL (ref 4.0–10.5)

## 2014-02-08 LAB — LIPID PANEL
Cholesterol: 222 mg/dL — ABNORMAL HIGH (ref 0–200)
HDL: 41.6 mg/dL (ref >39.00)
LDL Cholesterol: 158 mg/dL — ABNORMAL HIGH (ref 0–99)
NONHDL: 180.4
TRIGLYCERIDES: 113 mg/dL (ref 0.0–149.0)
Total CHOL/HDL Ratio: 5
VLDL: 22.6 mg/dL (ref 0.0–40.0)

## 2014-02-08 LAB — TSH: TSH: 1.11 u[IU]/mL (ref 0.35–4.50)

## 2014-02-09 LAB — COMPREHENSIVE METABOLIC PANEL
ALT: 19 U/L (ref 0–53)
AST: 16 U/L (ref 0–37)
Albumin: 3.7 g/dL (ref 3.5–5.2)
Alkaline Phosphatase: 48 U/L (ref 39–117)
BILIRUBIN TOTAL: 1.2 mg/dL (ref 0.2–1.2)
BUN: 9 mg/dL (ref 6–23)
CO2: 20 meq/L (ref 19–32)
CREATININE: 1 mg/dL (ref 0.4–1.5)
Calcium: 9.5 mg/dL (ref 8.4–10.5)
Chloride: 106 mEq/L (ref 96–112)
GFR: 90.67 mL/min (ref >60.00)
GLUCOSE: 117 mg/dL — AB (ref 70–99)
Potassium: 4.5 mEq/L (ref 3.5–5.1)
Sodium: 141 mEq/L (ref 135–145)
Total Protein: 7.1 g/dL (ref 6.0–8.3)

## 2014-02-15 ENCOUNTER — Other Ambulatory Visit (INDEPENDENT_AMBULATORY_CARE_PROVIDER_SITE_OTHER): Payer: 59

## 2014-02-15 DIAGNOSIS — R7309 Other abnormal glucose: Secondary | ICD-10-CM

## 2014-02-15 LAB — HEMOGLOBIN A1C: HEMOGLOBIN A1C: 5.5 % (ref 4.6–6.5)

## 2014-04-16 ENCOUNTER — Telehealth: Payer: Self-pay | Admitting: Internal Medicine

## 2014-04-16 NOTE — Telephone Encounter (Signed)
Due for a OV please arrange

## 2014-04-17 NOTE — Telephone Encounter (Signed)
Reminder letter printed and mailed to Pt.

## 2020-11-10 ENCOUNTER — Emergency Department (HOSPITAL_COMMUNITY): Payer: BC Managed Care – PPO

## 2020-11-10 ENCOUNTER — Other Ambulatory Visit: Payer: Self-pay

## 2020-11-10 ENCOUNTER — Emergency Department (HOSPITAL_COMMUNITY)
Admission: EM | Admit: 2020-11-10 | Discharge: 2020-11-10 | Disposition: A | Discharge status: Home / Self Care | Payer: BC Managed Care – PPO | Attending: Emergency Medicine | Admitting: Emergency Medicine

## 2020-11-10 DIAGNOSIS — S82141A Displaced bicondylar fracture of right tibia, initial encounter for closed fracture: Secondary | ICD-10-CM | POA: Insufficient documentation

## 2020-11-10 DIAGNOSIS — R21 Rash and other nonspecific skin eruption: Secondary | ICD-10-CM | POA: Diagnosis not present

## 2020-11-10 DIAGNOSIS — S8991XA Unspecified injury of right lower leg, initial encounter: Secondary | ICD-10-CM | POA: Diagnosis present

## 2020-11-10 DIAGNOSIS — Z23 Encounter for immunization: Secondary | ICD-10-CM | POA: Diagnosis not present

## 2020-11-10 DIAGNOSIS — Y92410 Unspecified street and highway as the place of occurrence of the external cause: Secondary | ICD-10-CM | POA: Diagnosis not present

## 2020-11-10 MED ORDER — ACETAMINOPHEN 325 MG PO TABS: 650.0000 mg | ORAL_TABLET | Freq: Four times a day (QID) | ORAL | 0 refills | Status: DC | PRN

## 2020-11-10 MED ORDER — OXYCODONE HCL 5 MG PO TABS: 5.0000 mg | ORAL_TABLET | Freq: Four times a day (QID) | ORAL | 0 refills | Status: DC | PRN

## 2020-11-10 MED ORDER — LIDOCAINE HCL (PF) 1 % IJ SOLN
INTRAMUSCULAR | Status: AC
  Filled 2020-11-10: qty 30

## 2020-11-10 MED ORDER — IBUPROFEN 600 MG PO TABS: 600.0000 mg | ORAL_TABLET | Freq: Four times a day (QID) | ORAL | 0 refills | Status: DC | PRN

## 2020-11-10 MED ORDER — HYDROMORPHONE HCL 1 MG/ML IJ SOLN
1.0000 mg | Freq: Once | INTRAMUSCULAR | Status: AC
  Administered 2020-11-10: 1 mg via INTRAVENOUS
  Filled 2020-11-10: qty 1

## 2020-11-10 MED ORDER — TETANUS-DIPHTH-ACELL PERTUSSIS 5-2.5-18.5 LF-MCG/0.5 IM SUSY
0.5000 mL | PREFILLED_SYRINGE | Freq: Once | INTRAMUSCULAR | Status: AC
  Administered 2020-11-10: 0.5 mL via INTRAMUSCULAR
  Filled 2020-11-10: qty 0.5

## 2020-11-10 MED ORDER — DOXYCYCLINE HYCLATE 100 MG PO CAPS: 100.0000 mg | ORAL_CAPSULE | Freq: Two times a day (BID) | ORAL | 0 refills | Status: AC

## 2020-11-10 NOTE — ED Notes (Signed)
Pt has road rash noted to bilateral arms .  Lac to right leg with some adipose tissue showing. Bleeding controlled.  Swelling noted to RLE
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
## 2020-11-10 NOTE — ED Provider Notes (Signed)
..  Laceration Repair  Date/Time: 11/10/2020 9:45 PM Performed by: Lenard Lance, MD Authorized by: Terald Sleeper, MD   Consent:    Consent obtained:  Verbal   Consent given by:  Patient   Risks, benefits, and alternatives were discussed: yes     Risks discussed:  Infection, pain, retained foreign body, poor cosmetic result, tendon damage, poor wound healing and need for additional repair   Alternatives discussed:  No treatment Universal protocol:    Procedure explained and questions answered to patient or proxy's satisfaction: yes     Patient identity confirmed:  Verbally with patient and arm band Anesthesia:    Anesthesia method:  Local infiltration   Local anesthetic:  Lidocaine 2% WITH epi Laceration details:    Location:  Leg   Leg location:  R lower leg   Length (cm):  3 Pre-procedure details:    Preparation:  Patient was prepped and draped in usual sterile fashion and imaging obtained to evaluate for foreign bodies Exploration:    Hemostasis achieved with:  Epinephrine and direct pressure   Foreign body noted: Imaging was suspicious for foreign body, but wound thoroughly explored and no foreign bodies noted.     Wound extent: no foreign bodies/material noted   Treatment:    Area cleansed with:  Povidone-iodine and saline   Amount of cleaning:  Extensive   Irrigation solution:  Sterile saline   Irrigation volume:  1L   Irrigation method:  Syringe   Visualized foreign bodies/material removed: no     Debridement:  Minimal Skin repair:    Repair method:  Staples and sutures   Suture size:  3-0   Wound skin closure material used: Ethilon.   Suture technique:  Simple interrupted and horizontal mattress   Number of sutures:  3 Approximation:    Approximation:  Close Repair type:    Repair type:  Intermediate Post-procedure details:    Procedure completion:  Tolerated well, no immediate complications     Lenard Lance, MD 11/10/20 2303    Terald Sleeper,  MD 11/11/20 [phone]

## 2020-11-10 NOTE — Progress Notes (Signed)
Orthopedic Tech Progress Note Patient Details:  Trevor Warren 27-Apr-1975 [id number]  Ortho Devices Type of Ortho Device: Crutches, Knee Immobilizer Ortho Device/Splint Location: rle Ortho Device/Splint Interventions: Ordered, Application, Adjustment   Post Interventions Patient Tolerated: Well Instructions Provided: Care of device, Adjustment of device  Trinna Post 11/10/2020, 11:43 PM

## 2020-11-10 NOTE — Discharge Instructions (Addendum)
Please call [facility] at the number above Monday morning at 8 AM to ask for an appointment in the office for your fracture.  They should be expecting to see you early next week.  It is possible that you will have an operation in your knee later this week.  In the meantime, we started you on antibiotics to prevent an infection in your leg.  We put 2 sutures in your leg.  You need to use crutches and should not put any weight at all on your right leg.  Try to keep it elevated at home.  We talked about signs and symptoms of compartment syndrome.  This would include severe worsening pain in your leg, numbness or weakness or loss of color or change in color in your foot.  If you experience these symptoms, please return to the ER.  Please do not take Suboxone at the same time as taking oxycodone.
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
## 2020-11-12 ENCOUNTER — Telehealth: Payer: Self-pay | Admitting: Orthopedic Surgery
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
## 2020-11-13 NOTE — Telephone Encounter (Signed)
Per Dr August Saucer this should be seen by Dr Carola Frost.

## 2020-11-19 ENCOUNTER — Encounter (HOSPITAL_COMMUNITY): Payer: Self-pay | Admitting: Orthopedic Surgery

## 2020-11-19 ENCOUNTER — Other Ambulatory Visit: Payer: Self-pay

## 2020-11-19 NOTE — Anesthesia Preprocedure Evaluation (Addendum)
Anesthesia Evaluation  Patient identified by MRN, date of birth, ID band Patient awake    Reviewed: Allergy & Precautions, NPO status , Patient's Chart, lab work & pertinent test results, reviewed documented beta blocker date and time   History of Anesthesia Complications Negative for: history of anesthetic complications  Airway Mallampati: II  TM Distance: >3 FB Neck ROM: Full    Dental  (+) Missing,    Pulmonary Current Smoker,    Pulmonary exam normal        Cardiovascular hypertension, Pt. on medications and Pt. on home beta blockers Normal cardiovascular exam     Neuro/Psych negative neurological ROS  negative psych ROS   GI/Hepatic negative GI ROS, Neg liver ROS,   Endo/Other  negative endocrine ROS  Renal/GU negative Renal ROS  negative genitourinary   Musculoskeletal Right tibial plateau fracture   Abdominal   Peds  Hematology negative hematology ROS (+)   Anesthesia Other Findings Hx of opioid dependence, on suboxone  Reproductive/Obstetrics negative OB ROS                            Anesthesia Physical Anesthesia Plan  ASA: 2  Anesthesia Plan: General   Post-op Pain Management:    Induction: Intravenous  PONV Risk Score and Plan: 2 and Midazolam, Ondansetron, Dexamethasone and Treatment may vary due to age or medical condition  Airway Management Planned: Oral ETT  Additional Equipment: None  Intra-op Plan:   Post-operative Plan: Extubation in OR  Informed Consent: I have reviewed the patients History and Physical, chart, labs and discussed the procedure including the risks, benefits and alternatives for the proposed anesthesia with the patient or authorized representative who has indicated his/her understanding and acceptance.     Dental advisory given  Plan Discussed with: CRNA  Anesthesia Plan Comments: (Ketamine 50mg  IV prior to incision then 10mg  Q1H  intraoperatively)      Anesthesia Quick Evaluation

## 2020-11-19 NOTE — Progress Notes (Addendum)
PCP - pt denies Cardiologist - denies EKG - 11/12/20 Chest x-ray -  ECHO -  Cardiac Cath -  CPAP -   COVID TEST- DOS  Anesthesia review: n/a  -------------  SDW INSTRUCTIONS:  Your procedure is scheduled on Tuesday 11/20/20. Please report to [facility] Main Entrance "A" at 05:30 A.M., and check in at the Admitting office. Call this number if you have problems the morning of surgery: [phone]   Remember: Do not eat or drink after midnight the night before your surgery   Medications to take morning of surgery with a sip of water include: carvedilol (COREG)   *per pt he hasn't taken any blood pressure medications in awhile  - has not taken coreg in about a year, no cardiologist - pt instructed to check BP at least twice tonight and once before coming to pre-op for surgery just to make sure BP isn't very high. Pt told to call and let us know if DBP is >110. Per wife, pt's blood pressure isn't usually very high.    If needed: acetaminophen (TYLENOL)  oxyCODONE (ROXICODONE)   As of today, STOP taking any Aspirin (unless otherwise instructed by your surgeon), Aleve, Naproxen, Ibuprofen, Motrin, Advil, Goody's, BC's, all herbal medications, fish oil, and all vitamins.    The Morning of Surgery Do not wear jewelry Do not wear lotions, powders, colognes, or deodorant Do not bring valuables to the hospital. [facility] is not responsible for any belongings or valuables.  If you are a smoker, DO NOT Smoke 24 hours prior to surgery  If you wear a CPAP at night please bring your mask the morning of surgery   Remember that you must have someone to transport you home after your surgery, and remain with you for 24 hours if you are discharged the same day.  Please bring cases for contacts, glasses, hearing aids, dentures or bridgework because it cannot be worn into surgery.   Patients discharged the day of surgery will not be allowed to drive home.   Please shower the NIGHT  BEFORE/MORNING OF SURGERY (use antibacterial soap like DIAL soap if possible). Wear comfortable clothes the morning of surgery. Oral Hygiene is also important to reduce your risk of infection.  Remember - BRUSH YOUR TEETH THE MORNING OF SURGERY WITH YOUR REGULAR TOOTHPASTE  Patient denies shortness of breath, fever, cough and chest pain.
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
## 2020-11-20 ENCOUNTER — Encounter (HOSPITAL_COMMUNITY)
Admission: RE | Disposition: A | Discharge status: Home / Self Care | Payer: Self-pay | Source: Home / Self Care | Attending: Orthopedic Surgery

## 2020-11-20 ENCOUNTER — Inpatient Hospital Stay (HOSPITAL_COMMUNITY)
Admission: RE | Admit: 2020-11-20 | Discharge: 2020-11-22 | DRG: 488 | Disposition: A | Discharge status: Home / Self Care | Payer: BC Managed Care – PPO | Attending: Orthopedic Surgery | Admitting: Orthopedic Surgery

## 2020-11-20 ENCOUNTER — Inpatient Hospital Stay (HOSPITAL_COMMUNITY): Payer: BC Managed Care – PPO | Admitting: Anesthesiology

## 2020-11-20 ENCOUNTER — Inpatient Hospital Stay (HOSPITAL_COMMUNITY): Payer: BC Managed Care – PPO

## 2020-11-20 ENCOUNTER — Observation Stay (HOSPITAL_BASED_OUTPATIENT_CLINIC_OR_DEPARTMENT_OTHER): Payer: BC Managed Care – PPO

## 2020-11-20 ENCOUNTER — Encounter (HOSPITAL_COMMUNITY): Payer: Self-pay | Admitting: Orthopedic Surgery

## 2020-11-20 ENCOUNTER — Observation Stay (HOSPITAL_COMMUNITY): Payer: BC Managed Care – PPO

## 2020-11-20 DIAGNOSIS — Z79891 Long term (current) use of opiate analgesic: Secondary | ICD-10-CM | POA: Diagnosis not present

## 2020-11-20 DIAGNOSIS — I82431 Acute embolism and thrombosis of right popliteal vein: Secondary | ICD-10-CM | POA: Diagnosis present

## 2020-11-20 DIAGNOSIS — D62 Acute posthemorrhagic anemia: Secondary | ICD-10-CM | POA: Diagnosis not present

## 2020-11-20 DIAGNOSIS — Z832 Family history of diseases of the blood and blood-forming organs and certain disorders involving the immune mechanism: Secondary | ICD-10-CM | POA: Diagnosis not present

## 2020-11-20 DIAGNOSIS — M898X9 Other specified disorders of bone, unspecified site: Secondary | ICD-10-CM | POA: Diagnosis present

## 2020-11-20 DIAGNOSIS — I10 Essential (primary) hypertension: Secondary | ICD-10-CM | POA: Diagnosis present

## 2020-11-20 DIAGNOSIS — Z833 Family history of diabetes mellitus: Secondary | ICD-10-CM

## 2020-11-20 DIAGNOSIS — Z8049 Family history of malignant neoplasm of other genital organs: Secondary | ICD-10-CM | POA: Diagnosis not present

## 2020-11-20 DIAGNOSIS — Z20822 Contact with and (suspected) exposure to covid-19: Secondary | ICD-10-CM | POA: Diagnosis present

## 2020-11-20 DIAGNOSIS — Z8249 Family history of ischemic heart disease and other diseases of the circulatory system: Secondary | ICD-10-CM | POA: Diagnosis not present

## 2020-11-20 DIAGNOSIS — R609 Edema, unspecified: Secondary | ICD-10-CM

## 2020-11-20 DIAGNOSIS — Z823 Family history of stroke: Secondary | ICD-10-CM | POA: Diagnosis not present

## 2020-11-20 DIAGNOSIS — F17210 Nicotine dependence, cigarettes, uncomplicated: Secondary | ICD-10-CM | POA: Diagnosis present

## 2020-11-20 DIAGNOSIS — S82141A Displaced bicondylar fracture of right tibia, initial encounter for closed fracture: Secondary | ICD-10-CM | POA: Diagnosis present

## 2020-11-20 DIAGNOSIS — Z419 Encounter for procedure for purposes other than remedying health state, unspecified: Principal | ICD-10-CM

## 2020-11-20 DIAGNOSIS — S83281A Other tear of lateral meniscus, current injury, right knee, initial encounter: Secondary | ICD-10-CM | POA: Diagnosis present

## 2020-11-20 DIAGNOSIS — T148XXA Other injury of unspecified body region, initial encounter: Secondary | ICD-10-CM

## 2020-11-20 DIAGNOSIS — F17200 Nicotine dependence, unspecified, uncomplicated: Secondary | ICD-10-CM | POA: Diagnosis present

## 2020-11-20 DIAGNOSIS — F1190 Opioid use, unspecified, uncomplicated: Secondary | ICD-10-CM | POA: Diagnosis present

## 2020-11-20 HISTORY — DX: Displaced bicondylar fracture of right tibia, initial encounter for closed fracture: S82.141A

## 2020-11-20 HISTORY — PX: ORIF TIBIA PLATEAU: SHX2132

## 2020-11-20 HISTORY — DX: Acute embolism and thrombosis of right popliteal vein: I82.431

## 2020-11-20 LAB — COMPREHENSIVE METABOLIC PANEL
ALT: 32 U/L (ref 0–44)
AST: 18 U/L (ref 15–41)
Albumin: 3.5 g/dL (ref 3.5–5.0)
Alkaline Phosphatase: 79 U/L (ref 38–126)
Anion gap: 7 (ref 5–15)
BUN: 10 mg/dL (ref 6–20)
CO2: 28 mmol/L (ref 22–32)
Calcium: 9.2 mg/dL (ref 8.9–10.3)
Chloride: 104 mmol/L (ref 98–111)
Creatinine, Ser: 0.93 mg/dL (ref 0.61–1.24)
GFR, Estimated: >60 mL/min (ref >60)
Glucose, Bld: 118 mg/dL — ABNORMAL HIGH (ref 70–99)
Potassium: 4.1 mmol/L (ref 3.5–5.1)
Sodium: 139 mmol/L (ref 135–145)
Total Bilirubin: 0.9 mg/dL (ref 0.3–1.2)
Total Protein: 6.3 g/dL — ABNORMAL LOW (ref 6.5–8.1)

## 2020-11-20 LAB — PROTIME-INR
INR: 1.1 (ref 0.8–1.2)
Prothrombin Time: 14.5 seconds (ref 11.4–15.2)

## 2020-11-20 LAB — CBC WITH DIFFERENTIAL/PLATELET
Abs Immature Granulocytes: 0.05 10*3/uL (ref 0.00–0.07)
Basophils Absolute: 0.1 10*3/uL (ref 0.0–0.1)
Basophils Relative: 1 %
Eosinophils Absolute: 0.1 10*3/uL (ref 0.0–0.5)
Eosinophils Relative: 2 %
HCT: 37.5 % — ABNORMAL LOW (ref 39.0–52.0)
Hemoglobin: 12.6 g/dL — ABNORMAL LOW (ref 13.0–17.0)
Immature Granulocytes: 1 %
Lymphocytes Relative: 20 %
Lymphs Abs: 1.8 10*3/uL (ref 0.7–4.0)
MCH: 32.2 pg (ref 26.0–34.0)
MCHC: 33.6 g/dL (ref 30.0–36.0)
MCV: 95.9 fL (ref 80.0–100.0)
Monocytes Absolute: 0.7 10*3/uL (ref 0.1–1.0)
Monocytes Relative: 8 %
Neutro Abs: 6.4 10*3/uL (ref 1.7–7.7)
Neutrophils Relative %: 68 %
Platelets: 280 10*3/uL (ref 150–400)
RBC: 3.91 MIL/uL — ABNORMAL LOW (ref 4.22–5.81)
RDW: 12.6 % (ref 11.5–15.5)
WBC: 9.1 10*3/uL (ref 4.0–10.5)
nRBC: 0 % (ref 0.0–0.2)

## 2020-11-20 LAB — CREATININE, SERUM
Creatinine, Ser: 0.92 mg/dL (ref 0.61–1.24)
GFR, Estimated: >60 mL/min (ref >60)

## 2020-11-20 LAB — CBC
HCT: 36.6 % — ABNORMAL LOW (ref 39.0–52.0)
Hemoglobin: 12.5 g/dL — ABNORMAL LOW (ref 13.0–17.0)
MCH: 32.4 pg (ref 26.0–34.0)
MCHC: 34.2 g/dL (ref 30.0–36.0)
MCV: 94.8 fL (ref 80.0–100.0)
Platelets: 324 10*3/uL (ref 150–400)
RBC: 3.86 MIL/uL — ABNORMAL LOW (ref 4.22–5.81)
RDW: 12.7 % (ref 11.5–15.5)
WBC: 14.7 10*3/uL — ABNORMAL HIGH (ref 4.0–10.5)
nRBC: 0 % (ref 0.0–0.2)

## 2020-11-20 LAB — VITAMIN D 25 HYDROXY (VIT D DEFICIENCY, FRACTURES): Vit D, 25-Hydroxy: 43.62 ng/mL (ref 30–100)

## 2020-11-20 LAB — SARS CORONAVIRUS 2 BY RT PCR (HOSPITAL ORDER, PERFORMED IN ~~LOC~~ HOSPITAL LAB): SARS Coronavirus 2: NEGATIVE

## 2020-11-20 SURGERY — OPEN REDUCTION INTERNAL FIXATION (ORIF) TIBIAL PLATEAU
Anesthesia: General | Site: Knee | Laterality: Right

## 2020-11-20 MED ORDER — ORAL CARE MOUTH RINSE: 15.0000 mL | Freq: Once | OROMUCOSAL | Status: AC

## 2020-11-20 MED ORDER — DEXMEDETOMIDINE HCL IN NACL 80 MCG/20ML IV SOLN
INTRAVENOUS | Status: AC
  Filled 2020-11-20: qty 20

## 2020-11-20 MED ORDER — CHLORHEXIDINE GLUCONATE 0.12 % MT SOLN
15.0000 mL | Freq: Once | OROMUCOSAL | Status: AC
  Administered 2020-11-20: 15 mL via OROMUCOSAL
  Filled 2020-11-20: qty 15

## 2020-11-20 MED ORDER — PROPOFOL 10 MG/ML IV BOLUS
INTRAVENOUS | Status: AC
  Filled 2020-11-20: qty 20

## 2020-11-20 MED ORDER — DIPHENHYDRAMINE HCL 50 MG/ML IJ SOLN: 12.5000 mg | Freq: Four times a day (QID) | INTRAMUSCULAR | Status: DC | PRN

## 2020-11-20 MED ORDER — DEXMEDETOMIDINE (PRECEDEX) IN NS 20 MCG/5ML (4 MCG/ML) IV SYRINGE
PREFILLED_SYRINGE | INTRAVENOUS | Status: DC | PRN
  Administered 2020-11-20 (×3): 8 ug via INTRAVENOUS
  Administered 2020-11-20: 4 ug via INTRAVENOUS

## 2020-11-20 MED ORDER — KETAMINE HCL 10 MG/ML IJ SOLN
INTRAMUSCULAR | Status: DC | PRN
  Administered 2020-11-20: 50 mg via INTRAVENOUS
  Administered 2020-11-20 (×6): 10 mg via INTRAVENOUS

## 2020-11-20 MED ORDER — CEFAZOLIN SODIUM-DEXTROSE 2-4 GM/100ML-% IV SOLN
2.0000 g | Freq: Four times a day (QID) | INTRAVENOUS | Status: AC
  Administered 2020-11-20 – 2020-11-21 (×3): 2 g via INTRAVENOUS
  Filled 2020-11-20 (×3): qty 100

## 2020-11-20 MED ORDER — DOCUSATE SODIUM 100 MG PO CAPS
100.0000 mg | ORAL_CAPSULE | Freq: Two times a day (BID) | ORAL | Status: DC
  Administered 2020-11-20 – 2020-11-22 (×3): 100 mg via ORAL
  Filled 2020-11-20 (×4): qty 1

## 2020-11-20 MED ORDER — POTASSIUM CHLORIDE IN NACL 20-0.9 MEQ/L-% IV SOLN
INTRAVENOUS | Status: DC
Start: 2020-11-20 — End: 2020-11-22
  Filled 2020-11-20 (×2): qty 1000

## 2020-11-20 MED ORDER — DIPHENHYDRAMINE HCL 12.5 MG/5ML PO ELIX
12.5000 mg | ORAL_SOLUTION | Freq: Four times a day (QID) | ORAL | Status: DC | PRN
Start: 2020-11-20 — End: 2020-11-21

## 2020-11-20 MED ORDER — CHLORHEXIDINE GLUCONATE CLOTH 2 % EX PADS: 6.0000 | MEDICATED_PAD | Freq: Every day | CUTANEOUS | Status: DC

## 2020-11-20 MED ORDER — DEXMEDETOMIDINE HCL IN NACL 200 MCG/50ML IV SOLN
INTRAVENOUS | Status: AC
  Filled 2020-11-20: qty 50

## 2020-11-20 MED ORDER — ACETAMINOPHEN 500 MG PO TABS
1000.0000 mg | ORAL_TABLET | Freq: Four times a day (QID) | ORAL | Status: DC
  Administered 2020-11-20 – 2020-11-22 (×8): 1000 mg via ORAL
  Filled 2020-11-20 (×8): qty 2

## 2020-11-20 MED ORDER — ROCURONIUM BROMIDE 10 MG/ML (PF) SYRINGE
PREFILLED_SYRINGE | INTRAVENOUS | Status: DC | PRN
  Administered 2020-11-20: 80 mg via INTRAVENOUS
  Administered 2020-11-20: 20 mg via INTRAVENOUS
  Administered 2020-11-20: 40 mg via INTRAVENOUS
  Administered 2020-11-20 (×2): 20 mg via INTRAVENOUS

## 2020-11-20 MED ORDER — LACTATED RINGERS IV SOLN: INTRAVENOUS | Status: DC

## 2020-11-20 MED ORDER — SUGAMMADEX SODIUM 200 MG/2ML IV SOLN
INTRAVENOUS | Status: DC | PRN
  Administered 2020-11-20: 260 mg via INTRAVENOUS

## 2020-11-20 MED ORDER — HYDROMORPHONE HCL 1 MG/ML IJ SOLN
1.0000 mg | Freq: Once | INTRAMUSCULAR | Status: AC
  Administered 2020-11-20: 1 mg via INTRAVENOUS

## 2020-11-20 MED ORDER — HYDROMORPHONE HCL 1 MG/ML IJ SOLN
INTRAMUSCULAR | Status: AC
  Filled 2020-11-20: qty 1

## 2020-11-20 MED ORDER — SODIUM CHLORIDE 0.9% FLUSH: 9.0000 mL | INTRAVENOUS | Status: DC | PRN

## 2020-11-20 MED ORDER — SUGAMMADEX SODIUM 500 MG/5ML IV SOLN
INTRAVENOUS | Status: AC
  Filled 2020-11-20: qty 5

## 2020-11-20 MED ORDER — MIDAZOLAM HCL 2 MG/2ML IJ SOLN
INTRAMUSCULAR | Status: AC
  Filled 2020-11-20: qty 2

## 2020-11-20 MED ORDER — POLYETHYLENE GLYCOL 3350 17 G PO PACK
17.0000 g | PACK | Freq: Every day | ORAL | Status: DC
Start: 2020-11-20 — End: 2020-11-22
  Administered 2020-11-21: 17 g via ORAL
  Filled 2020-11-20 (×2): qty 1

## 2020-11-20 MED ORDER — DEXAMETHASONE SODIUM PHOSPHATE 10 MG/ML IJ SOLN
INTRAMUSCULAR | Status: DC | PRN
  Administered 2020-11-20: 8 mg via INTRAVENOUS

## 2020-11-20 MED ORDER — GABAPENTIN 300 MG PO CAPS
300.0000 mg | ORAL_CAPSULE | Freq: Once | ORAL | Status: AC
  Administered 2020-11-20: 300 mg via ORAL
  Filled 2020-11-20: qty 1

## 2020-11-20 MED ORDER — OXYCODONE HCL 5 MG PO TABS
5.0000 mg | ORAL_TABLET | ORAL | Status: DC | PRN
  Administered 2020-11-20 – 2020-11-21 (×2): 10 mg via ORAL
  Filled 2020-11-20 (×2): qty 2

## 2020-11-20 MED ORDER — OXYCODONE HCL 5 MG PO TABS
10.0000 mg | ORAL_TABLET | ORAL | Status: DC | PRN
  Administered 2020-11-21 (×3): 15 mg via ORAL
  Administered 2020-11-22: 10 mg via ORAL
  Administered 2020-11-22 (×2): 15 mg via ORAL
  Filled 2020-11-20 (×3): qty 3
  Filled 2020-11-20: qty 2
  Filled 2020-11-20 (×2): qty 3

## 2020-11-20 MED ORDER — DEXTROSE 5 % IV SOLN
500.0000 mg | Freq: Three times a day (TID) | INTRAVENOUS | Status: DC
  Filled 2020-11-20: qty 5

## 2020-11-20 MED ORDER — NALOXONE HCL 0.4 MG/ML IJ SOLN: 0.4000 mg | INTRAMUSCULAR | Status: DC | PRN

## 2020-11-20 MED ORDER — ACETAMINOPHEN 500 MG PO TABS
1000.0000 mg | ORAL_TABLET | Freq: Once | ORAL | Status: DC
  Filled 2020-11-20: qty 2

## 2020-11-20 MED ORDER — GABAPENTIN 300 MG PO CAPS
ORAL_CAPSULE | ORAL | Status: AC
  Filled 2020-11-20: qty 1

## 2020-11-20 MED ORDER — LIDOCAINE 2% (20 MG/ML) 5 ML SYRINGE
INTRAMUSCULAR | Status: AC
  Filled 2020-11-20: qty 5

## 2020-11-20 MED ORDER — CEFAZOLIN IN SODIUM CHLORIDE 3-0.9 GM/100ML-% IV SOLN
INTRAVENOUS | Status: AC
  Filled 2020-11-20: qty 100

## 2020-11-20 MED ORDER — ONDANSETRON HCL 4 MG/2ML IJ SOLN
INTRAMUSCULAR | Status: AC
  Filled 2020-11-20: qty 2

## 2020-11-20 MED ORDER — ONDANSETRON HCL 4 MG/2ML IJ SOLN
INTRAMUSCULAR | Status: DC | PRN
  Administered 2020-11-20: 4 mg via INTRAVENOUS

## 2020-11-20 MED ORDER — CHLORHEXIDINE GLUCONATE CLOTH 2 % EX PADS
6.0000 | MEDICATED_PAD | Freq: Every day | CUTANEOUS | Status: DC
  Administered 2020-11-21: 6 via TOPICAL

## 2020-11-20 MED ORDER — ROCURONIUM BROMIDE 10 MG/ML (PF) SYRINGE
PREFILLED_SYRINGE | INTRAVENOUS | Status: AC
  Filled 2020-11-20: qty 10

## 2020-11-20 MED ORDER — PROPOFOL 10 MG/ML IV BOLUS
INTRAVENOUS | Status: DC | PRN
  Administered 2020-11-20: 200 mg via INTRAVENOUS

## 2020-11-20 MED ORDER — MIDAZOLAM HCL 2 MG/2ML IJ SOLN
INTRAMUSCULAR | Status: DC | PRN
  Administered 2020-11-20: 2 mg via INTRAVENOUS

## 2020-11-20 MED ORDER — CEFAZOLIN IN SODIUM CHLORIDE 3-0.9 GM/100ML-% IV SOLN
3.0000 g | INTRAVENOUS | Status: AC
  Administered 2020-11-20: 3 g via INTRAVENOUS
  Filled 2020-11-20: qty 100

## 2020-11-20 MED ORDER — ONDANSETRON HCL 4 MG PO TABS: 4.0000 mg | ORAL_TABLET | Freq: Four times a day (QID) | ORAL | Status: DC | PRN

## 2020-11-20 MED ORDER — RIVAROXABAN 20 MG PO TABS: 20.0000 mg | ORAL_TABLET | Freq: Every day | ORAL | Status: DC

## 2020-11-20 MED ORDER — KETOROLAC TROMETHAMINE 15 MG/ML IJ SOLN
15.0000 mg | Freq: Four times a day (QID) | INTRAMUSCULAR | Status: AC
  Administered 2020-11-20 – 2020-11-21 (×4): 15 mg via INTRAVENOUS
  Filled 2020-11-20 (×4): qty 1

## 2020-11-20 MED ORDER — KETAMINE HCL 50 MG/5ML IJ SOSY
PREFILLED_SYRINGE | INTRAMUSCULAR | Status: AC
  Filled 2020-11-20: qty 5

## 2020-11-20 MED ORDER — FENTANYL CITRATE (PF) 250 MCG/5ML IJ SOLN
INTRAMUSCULAR | Status: AC
  Filled 2020-11-20: qty 5

## 2020-11-20 MED ORDER — ACETAMINOPHEN 325 MG PO TABS: 325.0000 mg | ORAL_TABLET | Freq: Four times a day (QID) | ORAL | Status: DC | PRN

## 2020-11-20 MED ORDER — LIDOCAINE 2% (20 MG/ML) 5 ML SYRINGE
INTRAMUSCULAR | Status: DC | PRN
Start: 2020-11-20 — End: 2020-11-20
  Administered 2020-11-20: 100 mg via INTRAVENOUS

## 2020-11-20 MED ORDER — ONDANSETRON HCL 4 MG/2ML IJ SOLN: 4.0000 mg | Freq: Four times a day (QID) | INTRAMUSCULAR | Status: DC | PRN

## 2020-11-20 MED ORDER — METOCLOPRAMIDE HCL 5 MG PO TABS: 5.0000 mg | ORAL_TABLET | Freq: Three times a day (TID) | ORAL | Status: DC | PRN

## 2020-11-20 MED ORDER — RIVAROXABAN 15 MG PO TABS
15.0000 mg | ORAL_TABLET | Freq: Two times a day (BID) | ORAL | Status: DC
  Administered 2020-11-20 – 2020-11-22 (×4): 15 mg via ORAL
  Filled 2020-11-20 (×6): qty 1

## 2020-11-20 MED ORDER — MELOXICAM 7.5 MG PO TABS
15.0000 mg | ORAL_TABLET | Freq: Once | ORAL | Status: AC
  Administered 2020-11-20: 15 mg via ORAL
  Filled 2020-11-20: qty 1
  Filled 2020-11-20: qty 2

## 2020-11-20 MED ORDER — DEXAMETHASONE SODIUM PHOSPHATE 10 MG/ML IJ SOLN
INTRAMUSCULAR | Status: AC
  Filled 2020-11-20: qty 1

## 2020-11-20 MED ORDER — HYDROMORPHONE HCL 1 MG/ML IJ SOLN
0.2500 mg | INTRAMUSCULAR | Status: DC | PRN
  Administered 2020-11-20 (×3): 0.5 mg via INTRAVENOUS

## 2020-11-20 MED ORDER — METOCLOPRAMIDE HCL 5 MG/ML IJ SOLN: 5.0000 mg | Freq: Three times a day (TID) | INTRAMUSCULAR | Status: DC | PRN

## 2020-11-20 MED ORDER — HYDROMORPHONE HCL 1 MG/ML IJ SOLN
0.5000 mg | INTRAMUSCULAR | Status: DC | PRN
  Administered 2020-11-21 – 2020-11-22 (×4): 1 mg via INTRAVENOUS
  Filled 2020-11-20 (×4): qty 1

## 2020-11-20 MED ORDER — FENTANYL 50 MCG/ML IV PCA SOLN
INTRAVENOUS | Status: DC
Start: 2020-11-20 — End: 2020-11-21
  Administered 2020-11-20: 135 ug via INTRAVENOUS
  Administered 2020-11-21: 60 ug via INTRAVENOUS
  Administered 2020-11-21: 240 ug via INTRAVENOUS
  Administered 2020-11-21: 120 ug via INTRAVENOUS

## 2020-11-20 MED ORDER — METHOCARBAMOL 500 MG PO TABS
1000.0000 mg | ORAL_TABLET | Freq: Three times a day (TID) | ORAL | Status: DC
Start: 2020-11-20 — End: 2020-11-22
  Administered 2020-11-20 – 2020-11-22 (×7): 1000 mg via ORAL
  Filled 2020-11-20 (×7): qty 2

## 2020-11-20 MED ORDER — 0.9 % SODIUM CHLORIDE (POUR BTL) OPTIME
TOPICAL | Status: DC | PRN
  Administered 2020-11-20: 600 mL
  Administered 2020-11-20: 1000 mL

## 2020-11-20 MED ORDER — PROMETHAZINE HCL 25 MG/ML IJ SOLN: 6.2500 mg | INTRAMUSCULAR | Status: DC | PRN

## 2020-11-20 MED ORDER — FENTANYL CITRATE (PF) 100 MCG/2ML IJ SOLN
INTRAMUSCULAR | Status: AC
  Filled 2020-11-20: qty 2

## 2020-11-20 MED ORDER — ACETAMINOPHEN 500 MG PO TABS
1000.0000 mg | ORAL_TABLET | Freq: Once | ORAL | Status: AC
  Administered 2020-11-20: 1000 mg via ORAL
  Filled 2020-11-20: qty 2

## 2020-11-20 MED ORDER — CHLORHEXIDINE GLUCONATE 0.12 % MT SOLN
OROMUCOSAL | Status: AC
  Filled 2020-11-20: qty 15

## 2020-11-20 MED ORDER — HYDROMORPHONE HCL 1 MG/ML IJ SOLN
0.2500 mg | INTRAMUSCULAR | Status: DC | PRN
  Administered 2020-11-20 (×6): 0.5 mg via INTRAVENOUS

## 2020-11-20 MED ORDER — HYDROMORPHONE HCL 1 MG/ML IJ SOLN
INTRAMUSCULAR | Status: AC
  Filled 2020-11-20: qty 2

## 2020-11-20 MED ORDER — KETAMINE HCL 50 MG/5ML IJ SOSY
PREFILLED_SYRINGE | INTRAMUSCULAR | Status: AC
  Filled 2020-11-20: qty 10

## 2020-11-20 MED ORDER — FENTANYL CITRATE (PF) 100 MCG/2ML IJ SOLN
INTRAMUSCULAR | Status: DC | PRN
  Administered 2020-11-20: 50 ug via INTRAVENOUS
  Administered 2020-11-20: 100 ug via INTRAVENOUS
  Administered 2020-11-20 (×2): 50 ug via INTRAVENOUS

## 2020-11-20 MED ORDER — ALBUMIN HUMAN 5 % IV SOLN: INTRAVENOUS | Status: DC | PRN

## 2020-11-20 SURGICAL SUPPLY — 86 items
BAG COUNTER SPONGE SURGICOUNT (BAG) ×3 IMPLANT
BANDAGE ESMARK 6X9 LF (GAUZE/BANDAGES/DRESSINGS) ×2 IMPLANT
BIT DRILL CALIBR PROX 2.8 (BIT) ×3 IMPLANT
BLADE CLIPPER SURG (BLADE) IMPLANT
BLADE SURG 10 STRL SS (BLADE) ×3 IMPLANT
BLADE SURG 15 STRL LF DISP TIS (BLADE) ×2 IMPLANT
BLADE SURG 15 STRL SS (BLADE) ×3
BNDG COHESIVE 4X5 TAN STRL (GAUZE/BANDAGES/DRESSINGS) ×3 IMPLANT
BNDG ELASTIC 4X5.8 VLCR STR LF (GAUZE/BANDAGES/DRESSINGS) ×3 IMPLANT
BNDG ELASTIC 6X5.8 VLCR STR LF (GAUZE/BANDAGES/DRESSINGS) ×3 IMPLANT
BNDG ESMARK 6X9 LF (GAUZE/BANDAGES/DRESSINGS) ×3
BNDG GAUZE ELAST 4 BULKY (GAUZE/BANDAGES/DRESSINGS) ×3 IMPLANT
BONE CANC CHIPS 20CC PCAN1/4 (Bone Implant) ×3 IMPLANT
BRUSH SCRUB EZ PLAIN DRY (MISCELLANEOUS) ×6 IMPLANT
CANISTER SUCT 3000ML PPV (MISCELLANEOUS) ×3 IMPLANT
CHIPS CANC BONE 20CC PCAN1/4 (Bone Implant) ×2 IMPLANT
COVER SURGICAL LIGHT HANDLE (MISCELLANEOUS) ×6 IMPLANT
CUFF TOURN SGL QUICK 34 (TOURNIQUET CUFF) ×3
CUFF TRNQT CYL 34X4.125X (TOURNIQUET CUFF) ×2 IMPLANT
DRAPE C-ARM 42X72 X-RAY (DRAPES) ×3 IMPLANT
DRAPE C-ARMOR (DRAPES) ×3 IMPLANT
DRAPE HALF SHEET 40X57 (DRAPES) IMPLANT
DRAPE INCISE IOBAN 66X45 STRL (DRAPES) ×3 IMPLANT
DRAPE ORTHO SPLIT 77X108 STRL (DRAPES) ×6
DRAPE SURG ORHT 6 SPLT 77X108 (DRAPES) ×4 IMPLANT
DRAPE U-SHAPE 47X51 STRL (DRAPES) ×3 IMPLANT
DRSG ADAPTIC 3X8 NADH LF (GAUZE/BANDAGES/DRESSINGS) ×3 IMPLANT
DRSG AQUACEL AG ADV 3.5X 6 (GAUZE/BANDAGES/DRESSINGS) ×3 IMPLANT
DRSG MEPILEX BORDER 4X12 (GAUZE/BANDAGES/DRESSINGS) ×3 IMPLANT
DRSG PAD ABDOMINAL 8X10 ST (GAUZE/BANDAGES/DRESSINGS) ×6 IMPLANT
ELECT REM PT RETURN 9FT ADLT (ELECTROSURGICAL) ×3
ELECTRODE REM PT RTRN 9FT ADLT (ELECTROSURGICAL) ×2 IMPLANT
GAUZE SPONGE 4X4 12PLY STRL (GAUZE/BANDAGES/DRESSINGS) ×3 IMPLANT
GLOVE SRG 8 PF TXTR STRL LF DI (GLOVE) ×2 IMPLANT
GLOVE SURG ENC MOIS LTX SZ7.5 (GLOVE) ×3 IMPLANT
GLOVE SURG ENC MOIS LTX SZ8 (GLOVE) ×3 IMPLANT
GLOVE SURG UNDER POLY LF SZ7.5 (GLOVE) ×3 IMPLANT
GLOVE SURG UNDER POLY LF SZ8 (GLOVE) ×3
GOWN STRL REUS W/ TWL LRG LVL3 (GOWN DISPOSABLE) ×4 IMPLANT
GOWN STRL REUS W/ TWL XL LVL3 (GOWN DISPOSABLE) ×2 IMPLANT
GOWN STRL REUS W/TWL LRG LVL3 (GOWN DISPOSABLE) ×6
GOWN STRL REUS W/TWL XL LVL3 (GOWN DISPOSABLE) ×3
GUIDEWIRE TT NITINOL .062X9.25 (WIRE) ×9 IMPLANT
IMMOBILIZER KNEE 22 UNIV (SOFTGOODS) ×3 IMPLANT
KIT BASIN OR (CUSTOM PROCEDURE TRAY) ×3 IMPLANT
KIT TURNOVER KIT B (KITS) ×3 IMPLANT
MANIFOLD NEPTUNE II (INSTRUMENTS) ×3 IMPLANT
NS IRRIG 1000ML POUR BTL (IV SOLUTION) ×3 IMPLANT
PACK ORTHO EXTREMITY (CUSTOM PROCEDURE TRAY) ×3 IMPLANT
PAD ARMBOARD 7.5X6 YLW CONV (MISCELLANEOUS) ×6 IMPLANT
PAD CAST 4YDX4 CTTN HI CHSV (CAST SUPPLIES) ×2 IMPLANT
PADDING CAST COTTON 4X4 STRL (CAST SUPPLIES) ×3
PADDING CAST COTTON 6X4 STRL (CAST SUPPLIES) ×3 IMPLANT
PIN GUIDE 1.5X230 (PIN) ×9 IMPLANT
PLATE TIB PLT 5H RT (Plate) ×3 IMPLANT
SCREW LOCK VA 3.5X38 (Screw) ×9 IMPLANT
SCREW LOCK VA 3.5X40 (Screw) ×3 IMPLANT
SCREW LOCK VA 3.5X42 (Screw) ×3 IMPLANT
SCREW LOCK VA 3.5X80 (Screw) ×3 IMPLANT
SCREW LOCK VA 3.5X90 (Screw) ×9 IMPLANT
SCREW WORKHORSE 3.5X36 (Screw) ×6 IMPLANT
SCREW WORKHORSE 3.5X38 (Screw) ×3 IMPLANT
SCREW WORKHORSE 3.5X40 (Screw) ×3 IMPLANT
SCREW WORKHORSE 3.5X42 (Screw) ×3 IMPLANT
SCREW WORKHORSE 3.5X90 (Screw) ×6 IMPLANT
SPONGE T-LAP 18X18 ~~LOC~~+RFID (SPONGE) ×9 IMPLANT
STAPLER VISISTAT 35W (STAPLE) ×3 IMPLANT
STOCKINETTE IMPERVIOUS 9X36 MD (GAUZE/BANDAGES/DRESSINGS) ×3 IMPLANT
STOCKINETTE IMPERVIOUS LG (DRAPES) ×3 IMPLANT
SUCTION FRAZIER HANDLE 10FR (MISCELLANEOUS) ×3
SUCTION TUBE FRAZIER 10FR DISP (MISCELLANEOUS) ×2 IMPLANT
SUT ETHILON 2 0 PSLX (SUTURE) ×6 IMPLANT
SUT PROLENE 0 CT 2 (SUTURE) ×6 IMPLANT
SUT VIC AB 0 CT1 27 (SUTURE) ×3
SUT VIC AB 0 CT1 27XBRD ANBCTR (SUTURE) ×2 IMPLANT
SUT VIC AB 1 CT1 27 (SUTURE) ×3
SUT VIC AB 1 CT1 27XBRD ANBCTR (SUTURE) ×2 IMPLANT
SUT VIC AB 2-0 CT1 27 (SUTURE) ×6
SUT VIC AB 2-0 CT1 TAPERPNT 27 (SUTURE) ×4 IMPLANT
SYR 5ML LL (SYRINGE) ×6 IMPLANT
TOWEL GREEN STERILE (TOWEL DISPOSABLE) ×6 IMPLANT
TOWEL GREEN STERILE FF (TOWEL DISPOSABLE) ×3 IMPLANT
TRAY FOLEY MTR SLVR 16FR STAT (SET/KITS/TRAYS/PACK) IMPLANT
TUBE CONNECTING 12X1/4 (SUCTIONS) ×3 IMPLANT
WATER STERILE IRR 1000ML POUR (IV SOLUTION) ×6 IMPLANT
YANKAUER SUCT BULB TIP NO VENT (SUCTIONS) ×3 IMPLANT

## 2020-11-20 NOTE — Progress Notes (Signed)
Right lower extremity venous duplex completed. Refer to "CV Proc" under chart review to view preliminary results.  Preliminary results discussed with Dr. Carola Frost.  11/20/2020 2:48 PM Eula Fried., MHA, RVT, RDCS, RDMS
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
## 2020-11-20 NOTE — Transfer of Care (Signed)
Immediate Anesthesia Transfer of Care Note  Patient: Trevor Warren  Procedure(s) Performed: OPEN REDUCTION INTERNAL FIXATION (ORIF) TIBIAL PLATEAU (Right)  Patient Location: PACU  Anesthesia Type:General  Level of Consciousness: drowsy and patient cooperative  Airway & Oxygen Therapy: Patient Spontanous Breathing and Patient connected to nasal cannula oxygen  Post-op Assessment: Report given to RN  Post vital signs: Reviewed and stable  Last Vitals:  Vitals Value Taken Time  BP 137/65 11/20/20 1311  Temp 36.1 C 11/20/20 1310  Pulse 29 11/20/20 1312  Resp 15 11/20/20 1312  SpO2 92 % 11/20/20 1312  Vitals shown include unvalidated device data.  Last Pain:  Vitals:   11/20/20 0659  TempSrc:   PainSc: 1       Patients Stated Pain Goal: 2 (11/20/20 0659)  Complications: No notable events documented.

## 2020-11-20 NOTE — Anesthesia Postprocedure Evaluation (Signed)
Anesthesia Post Note  Patient: Trevor Warren  Procedure(s) Performed: OPEN REDUCTION INTERNAL FIXATION (ORIF) TIBIAL PLATEAU (Right)     Patient location during evaluation: PACU Anesthesia Type: General Level of consciousness: awake and alert and oriented Pain management: pain level controlled Vital Signs Assessment: post-procedure vital signs reviewed and stable Respiratory status: spontaneous breathing, nonlabored ventilation and respiratory function stable Cardiovascular status: blood pressure returned to baseline Postop Assessment: no apparent nausea or vomiting Anesthetic complications: no Comments: Pain control difficult in PACU. Has received dilaudid in excess of 5mg  IV, ketamine 10mg  IV x2, and precedex 28mg  IV. Pain relatively well controlled after these medications. VSS and SpO2>92%. RN has communicated with surgical service and PCA has been ordered for floor. , MD   No notable events documented.  Last Vitals:  Vitals:   11/20/20 1510 11/20/20 1525  BP: (!) 144/84 (!) 155/71  Pulse: 65 82  Resp: 15 15  Temp:    SpO2: 96% 95%    Last Pain:  Vitals:   11/20/20 1510  TempSrc:   PainSc: Asleep                 11/22/20
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
## 2020-11-20 NOTE — Progress Notes (Signed)
Orthopedic Tech Progress Note Patient Details:  [name] July 03, 1975 [id number]  Called in order to [employer] for a ROM KNEE BRACE UNLOCKED..  .. in house OVER HEAD FRAMES WITH TRAPEZE  hold up to 113.3 KG (250 LBS) patient is weighting in at 271.1lbs  Patient ID: Trevor Warren, male   DOB: [birth year], 45 y.o.   MRN: [id number]  Donald Pore 11/20/2020, 5:08 PM

## 2020-11-20 NOTE — Anesthesia Procedure Notes (Signed)
Procedure Name: Intubation Date/Time: 11/20/2020 8:40 AM Performed by: Barrington Ellison, CRNA Pre-anesthesia Checklist: Patient identified, Emergency Drugs available, Suction available and Patient being monitored Patient Re-evaluated:Patient Re-evaluated prior to induction Oxygen Delivery Method: Circle System Utilized Preoxygenation: Pre-oxygenation with 100% oxygen Induction Type: IV induction Ventilation: Mask ventilation without difficulty and Oral airway inserted - appropriate to patient size Laryngoscope Size: Mac and 4 Grade View: Grade I Tube type: Oral Tube size: 7.5 mm Number of attempts: 1 Airway Equipment and Method: Stylet and Oral airway Placement Confirmation: ETT inserted through vocal cords under direct vision, positive ETCO2 and breath sounds checked- equal and bilateral Secured at: 23 cm Tube secured with: Tape Dental Injury: Teeth and Oropharynx as per pre-operative assessment

## 2020-11-21 ENCOUNTER — Inpatient Hospital Stay (HOSPITAL_COMMUNITY): Payer: BC Managed Care – PPO

## 2020-11-21 ENCOUNTER — Other Ambulatory Visit (HOSPITAL_COMMUNITY): Payer: Self-pay

## 2020-11-21 LAB — CBC
HCT: 33.7 % — ABNORMAL LOW (ref 39.0–52.0)
Hemoglobin: 11.3 g/dL — ABNORMAL LOW (ref 13.0–17.0)
MCH: 32 pg (ref 26.0–34.0)
MCHC: 33.5 g/dL (ref 30.0–36.0)
MCV: 95.5 fL (ref 80.0–100.0)
Platelets: 265 10*3/uL (ref 150–400)
RBC: 3.53 MIL/uL — ABNORMAL LOW (ref 4.22–5.81)
RDW: 12.8 % (ref 11.5–15.5)
WBC: 11.5 10*3/uL — ABNORMAL HIGH (ref 4.0–10.5)
nRBC: 0 % (ref 0.0–0.2)

## 2020-11-21 LAB — COMPREHENSIVE METABOLIC PANEL
ALT: 22 U/L (ref 0–44)
AST: 14 U/L — ABNORMAL LOW (ref 15–41)
Albumin: 3.2 g/dL — ABNORMAL LOW (ref 3.5–5.0)
Alkaline Phosphatase: 71 U/L (ref 38–126)
Anion gap: 8 (ref 5–15)
BUN: 10 mg/dL (ref 6–20)
CO2: 23 mmol/L (ref 22–32)
Calcium: 8.6 mg/dL — ABNORMAL LOW (ref 8.9–10.3)
Chloride: 104 mmol/L (ref 98–111)
Creatinine, Ser: 0.84 mg/dL (ref 0.61–1.24)
GFR, Estimated: >60 mL/min (ref >60)
Glucose, Bld: 111 mg/dL — ABNORMAL HIGH (ref 70–99)
Potassium: 3.7 mmol/L (ref 3.5–5.1)
Sodium: 135 mmol/L (ref 135–145)
Total Bilirubin: 0.8 mg/dL (ref 0.3–1.2)
Total Protein: 5.8 g/dL — ABNORMAL LOW (ref 6.5–8.1)

## 2020-11-21 LAB — ANTITHROMBIN III: AntiThromb III Func: 128 % — ABNORMAL HIGH (ref 75–120)

## 2020-11-21 MED ORDER — BUPRENORPHINE HCL-NALOXONE HCL 2-0.5 MG SL SUBL
1.0000 | SUBLINGUAL_TABLET | Freq: Two times a day (BID) | SUBLINGUAL | Status: DC
  Administered 2020-11-21 – 2020-11-22 (×3): 1 via SUBLINGUAL
  Filled 2020-11-21 (×3): qty 1

## 2020-11-21 NOTE — Discharge Instructions (Addendum)
Information on my medicine - XARELTO (rivaroxaban)  This medication education was reviewed with me or my healthcare representative as part of my discharge preparation.    WHY WAS XARELTO PRESCRIBED FOR YOU? Xarelto was prescribed to treat blood clots that may have been found in the veins of your legs (deep vein thrombosis) or in your lungs (pulmonary embolism) and to reduce the risk of them occurring again.  What do you need to know about Xarelto? The starting dose is one 15 mg tablet taken TWICE daily with food for the FIRST 21 DAYS then on (enter date)  9/13  the dose is changed to one 20 mg tablet taken ONCE A DAY with your evening meal.  DO NOT stop taking Xarelto without talking to the health care provider who prescribed the medication.  Refill your prescription for 20 mg tablets before you run out.  After discharge, you should have regular check-up appointments with your healthcare provider that is prescribing your Xarelto.  In the future your dose may need to be changed if your kidney function changes by a significant amount.  What do you do if you miss a dose? If you are taking Xarelto TWICE DAILY and you miss a dose, take it as soon as you remember. You may take two 15 mg tablets (total 30 mg) at the same time then resume your regularly scheduled 15 mg twice daily the next day.  If you are taking Xarelto ONCE DAILY and you miss a dose, take it as soon as you remember on the same day then continue your regularly scheduled once daily regimen the next day. Do not take two doses of Xarelto at the same time.   Important Safety Information Xarelto is a blood thinner medicine that can cause bleeding. You should call your healthcare provider right away if you experience any of the following: Bleeding from an injury or your nose that does not stop. Unusual colored urine (red or dark brown) or unusual colored stools (red or black). Unusual bruising for unknown reasons. A serious fall  or if you hit your head (even if there is no bleeding).  Some medicines may interact with Xarelto and might increase your risk of bleeding while on Xarelto. To help avoid this, consult your healthcare provider or pharmacist prior to using any new prescription or non-prescription medications, including herbals, vitamins, non-steroidal anti-inflammatory drugs (NSAIDs) and supplements.  This website has more information on Xarelto: [email or web address].  ===================================================  Deep Vein Thrombosis    Deep vein thrombosis (DVT) is a condition in which a blood clot forms in a deep vein, such as a lower leg, thigh, or arm vein. A clot is blood that has thickened into a gel or solid. This condition is dangerous. It can lead to serious and even life-threatening complications if the clot travels to the lungs and causes a blockage (pulmonary embolism). It can also damage veins in the leg. This can result in leg pain, swelling, discoloration, and sores (post-thrombotic syndrome).  What are the causes? This condition may be caused by: A slowdown of blood flow. Damage to a vein. A condition that causes blood to clot more easily, such as an inherited clotting disorder.  What increases the risk? The following factors may make you more likely to develop this condition: Being overweight. Being older, especially over age 42. Sitting or lying down for more than four hours. Being in the hospital. Lack of physical activity (sedentary lifestyle). Pregnancy, being in childbirth, or having recently given birth.  Taking medicines that contain estrogen, such as medicines to prevent pregnancy. Smoking. A history of any of the following: Blood clots or a blood clotting disease. Peripheral vascular disease. Inflammatory bowel disease. Cancer. Heart disease. Genetic conditions that affect how your blood clots, such as Factor V Leiden mutation. Neurological diseases that affect your  legs (leg paresis). A recent injury, such as a car accident. Major or lengthy surgery. A central line placed inside a large vein.  What are the signs or symptoms? Symptoms of this condition include: Swelling, pain, or tenderness in an arm or leg. Warmth, redness, or discoloration in an arm or leg. If the clot is in your leg, symptoms may be more noticeable or worse when you stand or walk. Some people may not develop any symptoms.  How is this diagnosed? This condition is diagnosed with: A medical history and physical exam. Tests, such as: Blood tests. These are done to check how well your blood clots. Ultrasound. This is done to check for clots. Venogram. For this test, contrast dye is injected into a vein and X-rays are taken to check for any clots  How is this treated? Treatment for this condition depends on: The cause of your DVT. Your risk for bleeding or developing more clots. Any other medical conditions that you have. Treatment may include: Taking a blood thinner (anticoagulant). This type of medicine prevents clots from forming. It may be taken by mouth, injected under the skin, or injected through an IV (catheter). Injecting clot-dissolving medicines into the affected vein (catheter-directed thrombolysis). Having surgery. Surgery may be done to: Remove the clot. Place a filter in a large vein to catch blood clots before they reach the lungs. Some treatments may be continued for up to six months.  Follow these instructions at home: If you are taking blood thinners: Take the medicine exactly as told by your health care provider. Some blood thinners need to be taken at the same time every day. Do not skip a dose. Talk with your health care provider before you take any medicines that contain aspirin or NSAIDs. These medicines increase your risk for dangerous bleeding. Ask your health care provider about foods and drugs that could change the way the medicine works (may  interact). Avoid those things if your health care provider tells you to do so. Blood thinners can cause easy bruising and may make it difficult to stop bleeding. Because of this: Be very careful when using knives, scissors, or other sharp objects. Use an electric razor instead of a blade. Avoid activities that could cause injury or bruising, and follow instructions about how to prevent falls. Wear a medical alert bracelet or carry a card that lists what medicines you take.  General instructions Take over-the-counter and prescription medicines only as told by your health care provider. Return to your normal activities as told by your health care provider. Ask your health care provider what activities are safe for you. Wear compression stockings if recommended by your health care provider. Keep all follow-up visits as told by your health care provider. This is important.  How is this prevented? To lower your risk of developing this condition again: For 30 or more minutes every day, do an activity that: Involves moving your arms and legs. Increases your heart rate. When traveling for longer than four hours: Exercise your arms and legs every hour. Drink plenty of water. Avoid drinking alcohol. Avoid sitting or lying for a long time without moving your legs. If you have  surgery or you are hospitalized, ask about ways to prevent blood clots. These may include taking frequent walks or using anticoagulants. Stay at a healthy weight. If you are a woman who is older than age 3, avoid unnecessary use of medicines that contain estrogen, such as some birth control pills. Do not use any products that contain nicotine or tobacco, such as cigarettes and e-cigarettes. This is especially important if you take estrogen medicines. If you need help quitting, ask your health care provider.  Contact a health care provider if: You miss a dose of your blood thinner. Your menstrual period is heavier than  usual. You have unusual bruising.  Get help right away if: You have: New or increased pain, swelling, or redness in an arm or leg. Numbness or tingling in an arm or leg. Shortness of breath. Chest pain. A rapid or irregular heartbeat. A severe headache or confusion. A cut that will not stop bleeding. There is blood in your vomit, stool, or urine. You have a serious fall or accident, or you hit your head. You feel light-headed or dizzy. You cough up blood.  These symptoms may represent a serious problem that is an emergency. Do not wait to see if the symptoms will go away. Get medical help right away. Call your local emergency services (911 in the [place] Do not drive yourself to the hospital. Summary Deep vein thrombosis (DVT) is a condition in which a blood clot forms in a deep vein, such as a lower leg, thigh, or arm vein. Symptoms can include swelling, warmth, pain, and redness in your leg or arm. This condition may be treated with a blood thinner (anticoagulant medicine), medicine that is injected to dissolve blood clots,compression stockings, or surgery. If you are prescribed blood thinners, take them exactly as told. This information is not intended to replace advice given to you by your health care provider. Make sure you discuss any questions you have with your health care provider. Document Revised: 02/27/2017 Document Reviewed: 08/15/2016 Elsevier Patient Education  2020 [employer].      Orthopaedic Trauma Service Discharge Instructions   General Discharge Instructions  Orthopaedic Injuries:  Right tibial plateau fracture treated with open reduction internal fixation using plate and screws  WEIGHT BEARING STATUS: Nonweightbearing right leg.  Ambulate with the assistance of either walker or crutches  RANGE OF MOTION/ACTIVITY: Unrestricted range of motion of right knee.  Okay to leave brace off when seated or in bed.  Brace needs to be on when moving around.  Okay to  work on range of motion without brace on as well.  Activity as tolerated while maintaining weightbearing restrictions  Do not place pillows under the bend of the knee when at rest.  Keep knee straight when not working on range of motion.  Knee is either all the way straight or bent to 90 degrees when not working on motion.  Place pillow under ankle/heel to keep knee straight  Bone health: Vitamin D levels look good.  Continue with your home regimen of vitamin D supplementation.  Wound Care: Daily wet-to-dry dressings to right lower leg as we discussed.  Daily dressing changes to surgical site starting on 11/24/2020.  Discharge Wound Care Instructions  Do NOT apply any ointments, solutions or lotions to pin sites or surgical wounds.  These prevent needed drainage and even though solutions like hydrogen peroxide kill bacteria, they also damage cells lining the pin sites that help fight infection.  Applying lotions or ointments can keep  the wounds moist and can cause them to breakdown and open up as well. This can increase the risk for infection. When in doubt call the office.  Surgical incisions should be dressed daily.  If any drainage is noted, use one layer of adaptic, then gauze, Kerlix, and an ace wrap.  Alternatively you can use the Mepilex dressing which is the base dressing and the TED hose for swelling control  Once the incision is completely dry and without drainage, it may be left open to air out.  Showering may begin 36-48 hours later.  Cleaning gently with soap and water.  Traumatic wounds should be dressed daily as well.    One layer of adaptic, gauze, Kerlix, then ace wrap.  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  After you complete your current Xarelto prescription there is already a refill prescription has been sent to your home pharmacy.  We will obtain a ultrasound prior to discontinuation of therapy to ensure that clot has resolved.  We will also follow-up on labs to see if you need to be referred to a hematologist.  Diet: as you were eating previously.  Can use over the counter stool softeners and bowel preparations, such as Miralax, to help with bowel movements.  Narcotics can be constipating.  Be sure to drink plenty of fluids  PAIN MEDICATION USE AND EXPECTATIONS  You have likely been given narcotic medications to help control your pain.  After a traumatic event that results in an fracture (broken bone) with or without surgery, it is ok to use narcotic pain medications to help control one's pain.  We understand that everyone responds to pain differently and each individual patient will be evaluated on a regular basis for the continued need for narcotic medications. Ideally, narcotic medication use should last no more than 6-8 weeks (coinciding with fracture healing).   As a patient it is your responsibility as well to monitor narcotic medication use and report the amount and frequency you use these medications when you come to your office visit.   We would also advise that if you are using narcotic medications, you should take a dose prior to therapy to maximize you participation.  IF YOU ARE ON NARCOTIC MEDICATIONS IT IS NOT PERMISSIBLE TO OPERATE A MOTOR VEHICLE (MOTORCYCLE/CAR/TRUCK/MOPED) OR HEAVY MACHINERY DO NOT MIX NARCOTICS WITH OTHER CNS (CENTRAL NERVOUS SYSTEM) DEPRESSANTS SUCH AS ALCOHOL   POST-OPERATIVE OPIOID TAPER INSTRUCTIONS: It is important to wean off of your opioid medication as soon as possible. If you do not need pain medication after your surgery it is ok to stop day one. Opioids  include: Codeine, Hydrocodone(Norco, Vicodin), Oxycodone(Percocet, oxycontin) and hydromorphone amongst others.  Long term and even short term use of opiods can cause: Increased pain response Dependence Constipation Depression Respiratory depression And more.  Withdrawal symptoms can include Flu like symptoms Nausea, vomiting And more Techniques to manage these symptoms Hydrate well Eat regular healthy meals Stay active Use relaxation techniques(deep breathing, meditating, yoga) Do Not substitute Alcohol to help with tapering If you have been on opioids for less than two weeks  and do not have pain than it is ok to stop all together.  Plan to wean off of opioids This plan should start within one week post op of your fracture surgery  Maintain the same interval or time between taking each dose and first decrease the dose.  Cut the total daily intake of opioids by one tablet each day Next start to increase the time between doses. The last dose that should be eliminated is the evening dose.    STOP SMOKING OR USING NICOTINE PRODUCTS!!!!  As discussed nicotine severely impairs your body's ability to heal surgical and traumatic wounds but also impairs bone healing.  Wounds and bone heal by forming microscopic blood vessels (angiogenesis) and nicotine is a vasoconstrictor (essentially, shrinks blood vessels).  Therefore, if vasoconstriction occurs to these microscopic blood vessels they essentially disappear and are unable to deliver necessary nutrients to the healing tissue.  This is one modifiable factor that you can do to dramatically increase your chances of healing your injury.    (This means no smoking, no nicotine gum, patches, etc)  DO NOT USE NONSTEROIDAL ANTI-INFLAMMATORY DRUGS (NSAID'S)  Using products such as Advil (ibuprofen), Aleve (naproxen), Motrin (ibuprofen) for additional pain control during fracture healing can delay and/or prevent the healing response.  If you would  like to take over the counter (OTC) medication, Tylenol (acetaminophen) is ok.  However, some narcotic medications that are given for pain control contain acetaminophen as well. Therefore, you should not exceed more than 4000 mg of tylenol in a day if you do not have liver disease.  Also note that there are may OTC medicines, such as cold medicines and allergy medicines that my contain tylenol as well.  If you have any questions about medications and/or interactions please ask your doctor/PA or your pharmacist.      ICE AND ELEVATE INJURED/OPERATIVE EXTREMITY  Using ice and elevating the injured extremity above your heart can help with swelling and pain control.  Icing in a pulsatile fashion, such as 20 minutes on and 20 minutes off, can be followed.    Do not place ice directly on skin. Make sure there is a barrier between to skin and the ice pack.    Using frozen items such as frozen peas works well as the conform nicely to the are that needs to be iced.  USE AN ACE WRAP OR TED HOSE FOR SWELLING CONTROL  In addition to icing and elevation, Ace wraps or TED hose are used to help limit and resolve swelling.  It is recommended to use Ace wraps or TED hose until you are informed to stop.    When using Ace Wraps start the wrapping distally (farthest away from the body) and wrap proximally (closer to the body)   Example: If you had surgery on your leg or thing and you do not have a splint on, start the ace wrap at the toes and work your way up to the thigh        If you had surgery on your upper extremity and do not have a splint on, start the ace wrap at your fingers and work your way up to the upper arm  IF YOU ARE IN A SPLINT OR CAST DO NOT REMOVE IT FOR ANY REASON   If your splint gets wet for any reason please contact the office immediately. You may shower in your splint or cast as long as you keep it dry.  This can be done by wrapping  in a cast cover or garbage back (or similar)  Do Not stick any  thing down your splint or cast such as pencils, money, or hangers to try and scratch yourself with.  If you feel itchy take benadryl as prescribed on the bottle for itching  IF YOU ARE IN A CAM BOOT (BLACK BOOT)  You may remove boot periodically. Perform daily dressing changes as noted below.  Wash the liner of the boot regularly and wear a sock when wearing the boot. It is recommended that you sleep in the boot until told otherwise    Call office for the following: Temperature greater than 101F Persistent nausea and vomiting Severe uncontrolled pain Redness, tenderness, or signs of infection (pain, swelling, redness, odor or green/yellow discharge around the site) Difficulty breathing, headache or visual disturbances Hives Persistent dizziness or light-headedness Extreme fatigue Any other questions or concerns you may have after discharge  In an emergency, call 911 or go to an Emergency Department at a nearby hospital  HELPFUL INFORMATION  If you had a block, it will wear off between 8-24 hrs postop typically.  This is period when your pain may go from nearly zero to the pain you would have had postop without the block.  This is an abrupt transition but nothing dangerous is happening.  You may take an extra dose of narcotic when this happens.  You should wean off your narcotic medicines as soon as you are able.  Most patients will be off or using minimal narcotics before their first postop appointment.   We suggest you use the pain medication the first night prior to going to bed, in order to ease any pain when the anesthesia wears off. You should avoid taking pain medications on an empty stomach as it will make you nauseous.  Do not drink alcoholic beverages or take illicit drugs when taking pain medications.  In most states it is against the law to drive while you are in a splint or sling.  And certainly against the law to drive while taking narcotics.  You may return to  work/school in the next couple of days when you feel up to it.   Pain medication may make you constipated.  Below are a few solutions to try in this order: Decrease the amount of pain medication if you aren't having pain. Drink lots of decaffeinated fluids. Drink prune juice and/or each dried prunes  If the first 3 don't work start with additional solutions Take Colace - an over-the-counter stool softener Take Senokot - an over-the-counter laxative Take Miralax - a stronger over-the-counter laxative     CALL THE OFFICE WITH ANY QUESTIONS OR CONCERNS: [phone]   VISIT OUR WEBSITE FOR ADDITIONAL INFORMATION: [email or web address]

## 2020-11-21 NOTE — Progress Notes (Signed)
PCA pump removed. Remaining 46mL of fentanyl wasted in pyxis with charge nurse Leotis Shames, RN.

## 2020-11-21 NOTE — TOC Benefit Eligibility Note (Signed)
Patient Product/process development scientist completed.    The patient is currently admitted and upon discharge could be taking Xarelto 20 mg.  The current 30 day co-pay is, $35.00.   The patient is insured through [facility] [place] [employer]     Roland Earl, CPhT Pharmacy Patient Advocate Specialist [facility] Antimicrobial Stewardship Team Direct Number: [phone]  Fax: [phone]
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
## 2020-11-22 ENCOUNTER — Other Ambulatory Visit (HOSPITAL_COMMUNITY): Payer: Self-pay

## 2020-11-22 ENCOUNTER — Encounter (HOSPITAL_COMMUNITY): Payer: Self-pay | Admitting: Orthopedic Surgery

## 2020-11-22 DIAGNOSIS — F1190 Opioid use, unspecified, uncomplicated: Secondary | ICD-10-CM

## 2020-11-22 HISTORY — DX: Opioid use, unspecified, uncomplicated: F11.90

## 2020-11-22 LAB — CBC
HCT: 32 % — ABNORMAL LOW (ref 39.0–52.0)
Hemoglobin: 10.5 g/dL — ABNORMAL LOW (ref 13.0–17.0)
MCH: 31.5 pg (ref 26.0–34.0)
MCHC: 32.8 g/dL (ref 30.0–36.0)
MCV: 96.1 fL (ref 80.0–100.0)
Platelets: 247 10*3/uL (ref 150–400)
RBC: 3.33 MIL/uL — ABNORMAL LOW (ref 4.22–5.81)
RDW: 12.9 % (ref 11.5–15.5)
WBC: 9.6 10*3/uL (ref 4.0–10.5)
nRBC: 0 % (ref 0.0–0.2)

## 2020-11-22 LAB — HOMOCYSTEINE: Homocysteine: 12 umol/L (ref 0.0–14.5)

## 2020-11-22 MED ORDER — DOCUSATE SODIUM 100 MG PO CAPS
100.0000 mg | ORAL_CAPSULE | Freq: Two times a day (BID) | ORAL | 0 refills | Status: AC
  Filled 2020-11-22: qty 20, 10d supply, fill #0

## 2020-11-22 MED ORDER — METHOCARBAMOL 500 MG PO TABS
1000.0000 mg | ORAL_TABLET | Freq: Four times a day (QID) | ORAL | 1 refills | Status: AC | PRN
  Filled 2020-11-22: qty 60, 8d supply, fill #0

## 2020-11-22 MED ORDER — BUPRENORPHINE HCL-NALOXONE HCL 8-2 MG SL SUBL: 1.0000 | SUBLINGUAL_TABLET | Freq: Two times a day (BID) | SUBLINGUAL | Status: AC

## 2020-11-22 MED ORDER — OXYCODONE-ACETAMINOPHEN 7.5-325 MG PO TABS
1.0000 | ORAL_TABLET | Freq: Four times a day (QID) | ORAL | 0 refills | Status: AC | PRN
  Filled 2020-11-22: qty 50, 7d supply, fill #0

## 2020-11-22 MED ORDER — KETOROLAC TROMETHAMINE 10 MG PO TABS
10.0000 mg | ORAL_TABLET | Freq: Four times a day (QID) | ORAL | 0 refills | Status: AC | PRN
  Filled 2020-11-22: qty 20, 5d supply, fill #0

## 2020-11-22 MED ORDER — RIVAROXABAN (XARELTO) VTE STARTER PACK (15 & 20 MG)
ORAL_TABLET | ORAL | 0 refills | Status: AC
  Filled 2020-11-22: qty 51, 30d supply, fill #0
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
## 2020-11-22 NOTE — Plan of Care (Signed)

## 2020-11-22 NOTE — Plan of Care (Signed)
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
## 2020-11-22 NOTE — Discharge Summary (Signed)
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 op of your joint replacement. Maintain the same interval or time between taking each dose and first decrease the dose.  Cut the total daily intake of opioids by one tablet each day Next start to increase the time between doses. The last dose that should be eliminated is the evening dose.         Allergies as of 11/22/2020   No Known Allergies      Medication List     STOP taking these medications    acetaminophen 325 MG tablet Commonly known as: Tylenol   ibuprofen 200 MG tablet Commonly known as: ADVIL   ibuprofen 600 MG tablet Commonly known as: ADVIL   oxyCODONE 5  MG immediate release tablet Commonly known as: Roxicodone       TAKE these medications    buprenorphine-naloxone 8-2 mg Subl SL tablet Commonly known as: SUBOXONE Place 1 tablet under the tongue 2 (two) times  daily. Start taking on: December 06, 2020 What changed: These instructions start on December 06, 2020. If you are unsure what to do until then, ask your doctor or other care provider.   cholecalciferol 25 MCG (1000 UNIT) tablet Commonly known as: VITAMIN D3 Take 1,000 Units by mouth daily.   docusate sodium 100 MG capsule Commonly known as: COLACE Take 1 capsule (100 mg total) by mouth 2 (two) times daily.   fexofenadine 180 MG tablet Commonly known as: ALLEGRA Take 180 mg by mouth daily.   ketorolac 10 MG tablet Commonly known as: TORADOL Take 1 tablet (10 mg total) by mouth every 6 (six) hours as needed for moderate pain.   methocarbamol 500 MG tablet Commonly known as: ROBAXIN Take 2 tablets (1,000 mg total) by mouth every 6 (six) hours as needed for muscle spasms.   oxyCODONE-acetaminophen 7.5-325 MG tablet Commonly known as: Percocet Take 1-2 tablets by mouth every 6 (six) hours as needed for severe pain.   Rivaroxaban Stater Pack (15 mg and 20 mg) Commonly known as: XARELTO STARTER PACK Follow package directions: Take one 15mg  tablet by mouth twice a day. On day 22, switch to one 20mg  tablet once a day. Take with food.   zinc gluconate 50 MG tablet Take 50 mg by mouth daily.               Discharge Care Instructions  (From admission, onward)           Start     Ordered   11/22/20 0000  Non weight bearing       Question Answer Comment  Laterality right   Extremity Lower      11/22/20 1409            Follow-up Information     11/24/20, MD. Schedule an appointment as soon as possible for a visit in 10 day(s).   Specialty: Orthopedic Surgery Contact information: [address] [place] [address] [place] [phone]                 Discharge Instructions and Plan:  45 year old male right tibial plateau fracture s/p ORIF, acute right leg DVT (present on admission)   Weightbearing: NWB RLE Insicional and dressing care: Daily  dressing changes with Mepilex to the surgical sites and wet-to-dry dressings to right lower leg Orthopedic device(s):  Walker/crutches, hinged knee brace Showering: Okay to shower and clean wounds with soap and water once they are dry VTE prophylaxis:  Xarelto for DVT treatment protocol x   3 months Pain control: Multimodal: Tylenol, Percocet, Robaxin.  We will resume his Suboxone in 14 days Bone Health/Optimization: Continue with home vitamin D supplementation Follow - up plan:  7 to 10 days Contact information: [phone] MD, 55 PA-C   Signed:  Myrene Galas, PA-C [phone] Mearl Latin) 11/22/2020, 2:15 PM  [facility] [address] [place] [address] [address] [place] [phone])

## 2020-11-22 NOTE — Progress Notes (Signed)
Reviewed pt discharged information. Pt. Verbalized understanding of all instruction on follow-up care. All personal belonging sent home with pt. Pt transported via wheelchair to personal car by nurse tech.

## 2020-11-23 LAB — BETA-2-GLYCOPROTEIN I ABS, IGG/M/A
Beta-2 Glyco I IgG: <9 GPI IgG units (ref 0–20)
Beta-2-Glycoprotein I IgA: <9 GPI IgA units (ref 0–25)
Beta-2-Glycoprotein I IgM: <9 GPI IgM units (ref 0–32)

## 2020-11-23 LAB — PROTEIN C, TOTAL: Protein C, Total: 83 % (ref 60–150)

## 2020-11-23 LAB — CARDIOLIPIN ANTIBODIES, IGG, IGM, IGA
Anticardiolipin IgA: <9 APL U/mL (ref 0–11)
Anticardiolipin IgG: <9 GPL U/mL (ref 0–14)
Anticardiolipin IgM: <9 MPL U/mL (ref 0–12)

## 2020-11-26 LAB — LUPUS ANTICOAGULANT PANEL
DRVVT: 71.9 s — ABNORMAL HIGH (ref 0.0–47.0)
PTT Lupus Anticoagulant: 39.1 s (ref 0.0–51.9)

## 2020-11-26 LAB — DRVVT MIX: dRVVT Mix: 57.6 s — ABNORMAL HIGH (ref 0.0–40.4)

## 2020-11-26 LAB — PROTEIN C ACTIVITY: Protein C Activity: 124 % (ref 73–180)

## 2020-11-26 LAB — DRVVT CONFIRM: dRVVT Confirm: 1.4 ratio — ABNORMAL HIGH (ref 0.8–1.2)

## 2020-11-26 LAB — PROTEIN S ACTIVITY: Protein S Activity: 117 % (ref 63–140)

## 2020-11-26 LAB — PROTEIN S, TOTAL: Protein S Ag, Total: 83 % (ref 60–150)

## 2020-11-27 LAB — FACTOR 5 LEIDEN

## 2020-11-27 LAB — PROTHROMBIN GENE MUTATION

## 2020-12-03 NOTE — Op Note (Addendum)
8:09 AM 12/03/2020 11/20/2020  PATIENT:  Trevor Warren  45 y.o. male  [id number]  PRE-OPERATIVE DIAGNOSIS:  FRACTURE RIGHT TIBIAL PLATEAU  POST-OPERATIVE DIAGNOSIS:  FRACTURE RIGHT TIBIAL PLATEAU  PROCEDURE:   1. ORIF OF RIGHT LATERAL TIBIAL PLATEAU FRACTURE 2. ARTHROTOMY WITH PARTIAL MENISCECTOMY RIGHT LATERAL MENISCUS 3. ANTERIOR COMPARTMENT FASCIOTOMY 4. MANUAL APPLICATION OF STRESS UNDER FLUOROSCOPY RIGHT KNEE  SURGEON:  Surgeon(s) and Role:    Myrene Galas, MD - Primary  PHYSICIAN ASSISTANT: Montez Morita, PA-C  ANESTHESIA:   general  I/O:  No intake/output data recorded.  SPECIMEN:  None  TOURNIQUET:  * No tourniquets in log *  DICTATION: .Note written in EPIC  DISPOSITION: PACU  CONDITION: STABLE     BRIEF SUMMARY AND INDICATION FOR PROCEDURE:  Patient is a 45 y.o.-year- old with a tibial plateau fracture, treated provisionally with aggressive ice, elevation, and active motion of the foot and toes to facilitate resolution of soft tissue swelling.  We did discuss with the patient the risks and benefits of surgical treatment including the potential for arthritis, nerve injury, vessel injury, loss of motion, DVT, PE, heart attack, stroke, symptomatic hardware, need for further surgery, and multiple others.  The patient acknowledged these risks and wished to proceed.   BRIEF SUMMARY OF PROCEDURE:  After administration of preoperative antibiotics, the patient was taken to the operating room.  General anesthesia was induced. The lower extremity prepped and draped in usual sterile fashion using a chlorhexidine wash and betadine scrub and paint. A timeout was performed.   I then brought in the radiolucent triangle.  A curvilinear incision was made extending laterally over Gerdy's tubercle. Dissection was carried down where the soft tissues were left intact to the lateral plateau and rim.  I did incise the retinaculum proximal to the tibial plateau, and then going along inside  the retinaculum performed a submeniscal Arthrotomy, releasing the coronary ligament along its insertion onto the tibia. Zero prolene suture was used to reflect this and inspect the meniscus and joint surface. The joint was irrigated thoroughly and this revealed the lateral meniscus tear.  I was able to use a series of biters as well as a 15 blade to sharply debride the torn area back to a stable and healthy appearing edge.  The joint surface was markedly depressed.  We then released some of the anterior extensors to enable the plate to fit along the proximal shaft. The fracture site had to be booked open to enable reduction and this also assisted with treatment of the meniscusThrough this, I reduced the articular segments and then introduced a series of tamps and with my assistant pulling traction, I was able to elevate the articular surface in sequential fashion while watching it through the arthrotomy.  Once I had restored appropriate Height to the lateral plateau, the bone defect was grafted with cancellous chips.  I then placed the plate laterally and used the [employer] clamp to apply a compressive force across the joint line. This reduced the widened plateau back to the appropriate size.  At this point, we placed standard fixation in the proximal row of the plate followed by locked fixation.  The defect in the metaphysis was filled with cancellous bone and tamped into place. This was followed by additional fixation within the shaft. My assistant was careful to control alignment throughout by using traction and bending forces. He also assited with retraction. All wounds were irrigated thoroughly. Final AP and LAT fluoro images showed restoration of alignment,  reduction, and varus/ valgus stability on stress view.   Prior to closure, I turned my attention to the distal edge of the wound here underneath the skin.  I used the long scissors to spread both superficial and deep to the anterior  compartment.  The fascia was then released for 8 to 10 cm to reduce the likelihood of the postoperative compartment syndrome.  Once more, wound was irrigated and then a standard layered closure performed, 0 Vicryl, 2-0 Vicryl, and 3-0 nylon for the skin.  Sterile gently compressive dressing was applied and in knee immobilizer.  The patient was taken to the PACU in stable condition.   PROGNOSIS: The patient will proceed to ultrasound to evaluate for DVT given the degree of swelling. He will subsequently be transitioned into a hinged knee brace with unrestricted range of motion and this will begin immediately.  Orders entered for nonweightbearing on the operative extremity, pharmacologic DVT prophylaxis, and mobilization with PT and OT. After discharge, we will plan to see the patient back in about 2 weeks for removal of sutures and we will continue to follow throughout the hospital stay.         Doralee Albino. Carola Frost, M.D.

## 2020-12-06 ENCOUNTER — Other Ambulatory Visit (HOSPITAL_COMMUNITY): Payer: Self-pay

## 2020-12-07 ENCOUNTER — Other Ambulatory Visit (HOSPITAL_COMMUNITY): Payer: Self-pay

## 2020-12-07 ENCOUNTER — Telehealth (HOSPITAL_COMMUNITY): Payer: Self-pay | Admitting: Pharmacist

## 2020-12-13 NOTE — Telephone Encounter (Signed)
Transitions of Care Pharmacy   Call attempted for a pharmacy transitions of care follow-up. HIPAA appropriate voicemail was left with call back information provided.   Call attempt #3. Will no longer attempt follow up for TOC pharmacy.   

## 2021-01-30 ENCOUNTER — Ambulatory Visit
Admission: RE | Admit: 2021-01-30 | Discharge: 2021-01-30 | Disposition: A | Discharge status: Home / Self Care | Payer: BC Managed Care – PPO | Source: Ambulatory Visit | Attending: Orthopedic Surgery | Admitting: Orthopedic Surgery

## 2021-01-30 ENCOUNTER — Other Ambulatory Visit: Payer: Self-pay | Admitting: Orthopedic Surgery

## 2021-01-30 DIAGNOSIS — M7989 Other specified soft tissue disorders: Secondary | ICD-10-CM

## 2021-01-30 DIAGNOSIS — M79604 Pain in right leg: Secondary | ICD-10-CM

## 2021-03-11 ENCOUNTER — Ambulatory Visit: Payer: Self-pay | Admitting: General Surgery
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
## 2022-03-03 IMAGING — DX DG KNEE COMPLETE 4+V*R*
4 series · 4 of 4 positions shown · non-contrast
Comparison: None.

CLINICAL DATA: Pain after trauma

EXAM:
RIGHT KNEE - COMPLETE 4+ VIEW

[knee ap]
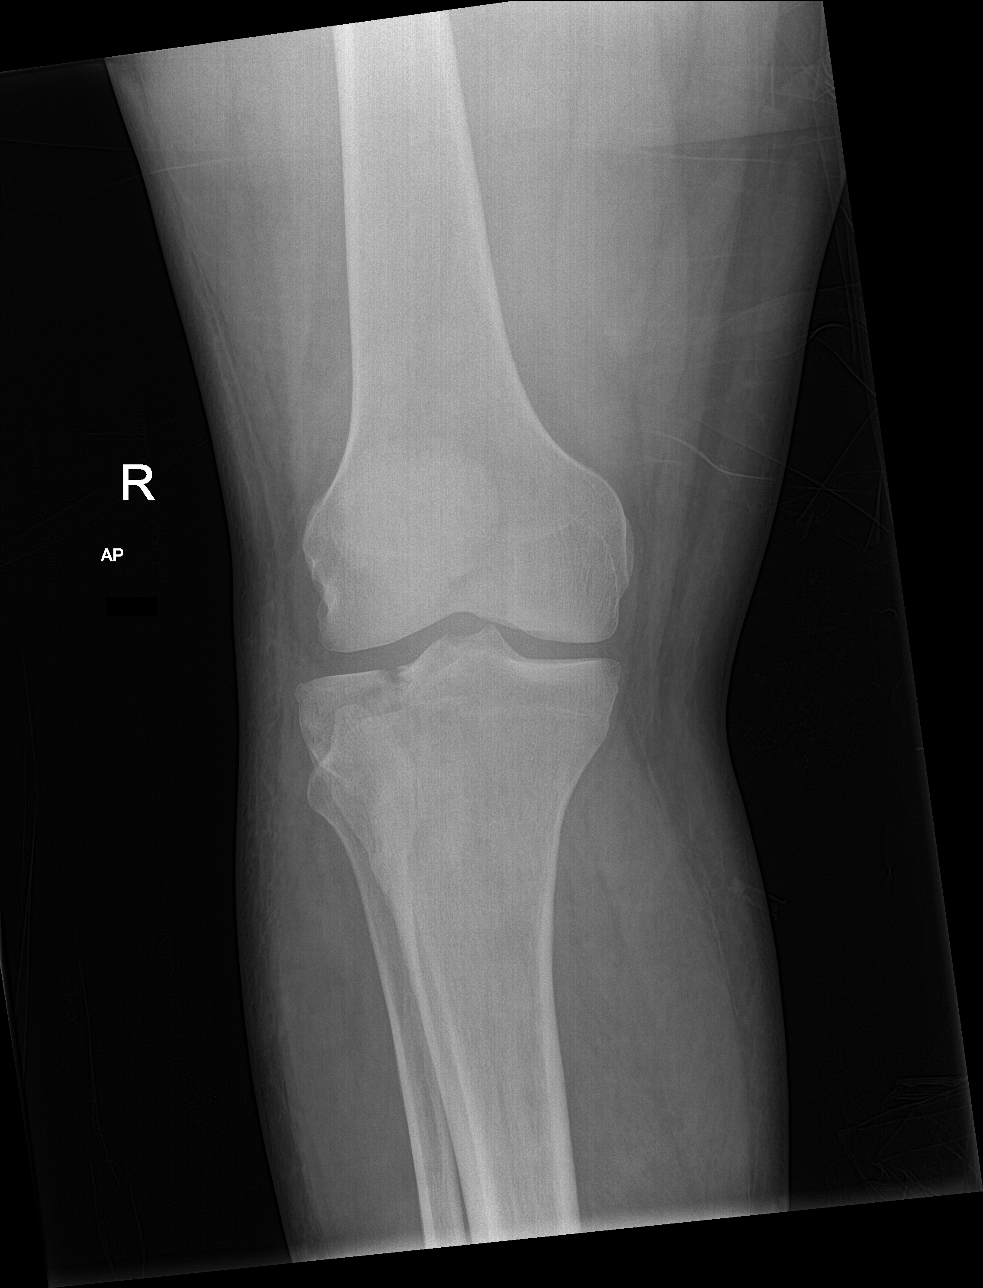

[knee lat]
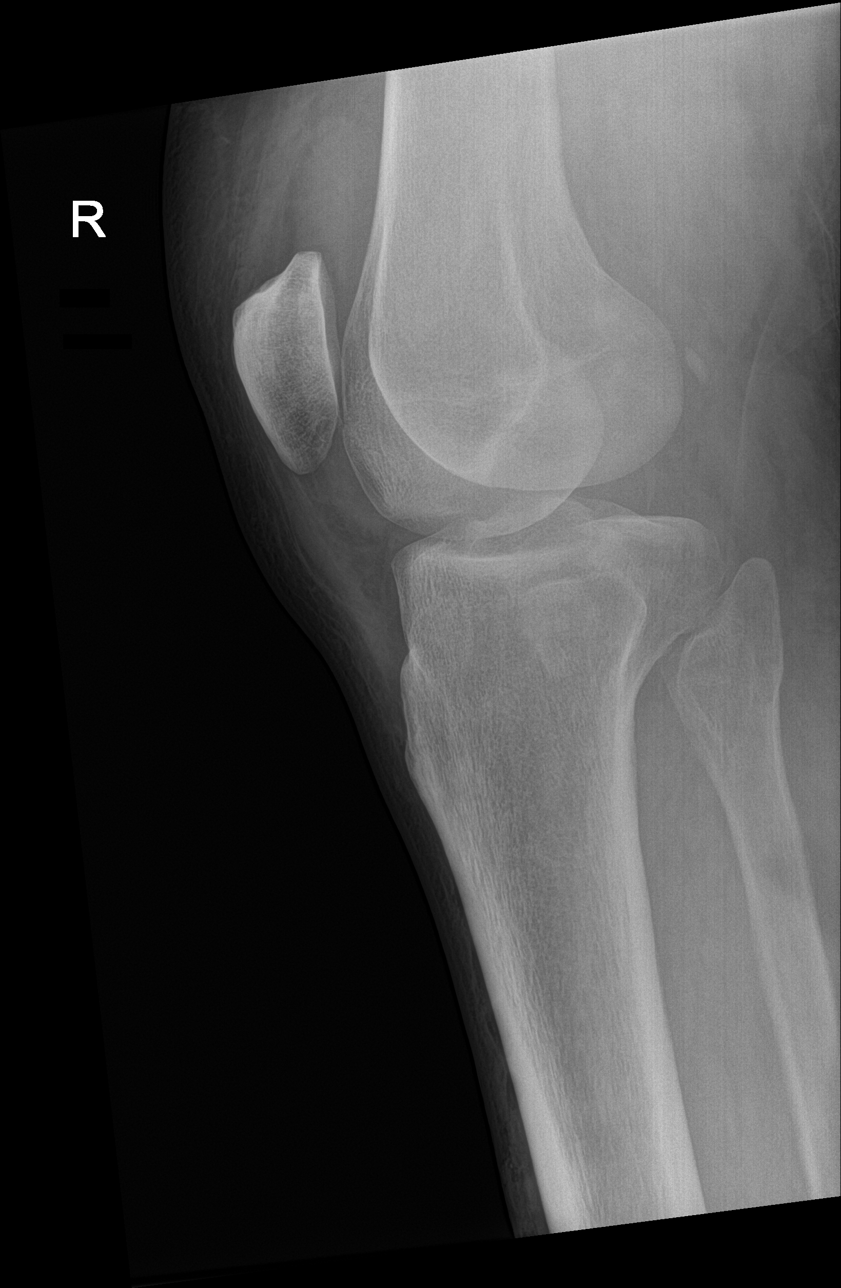

[knee obl (1 of 2)]
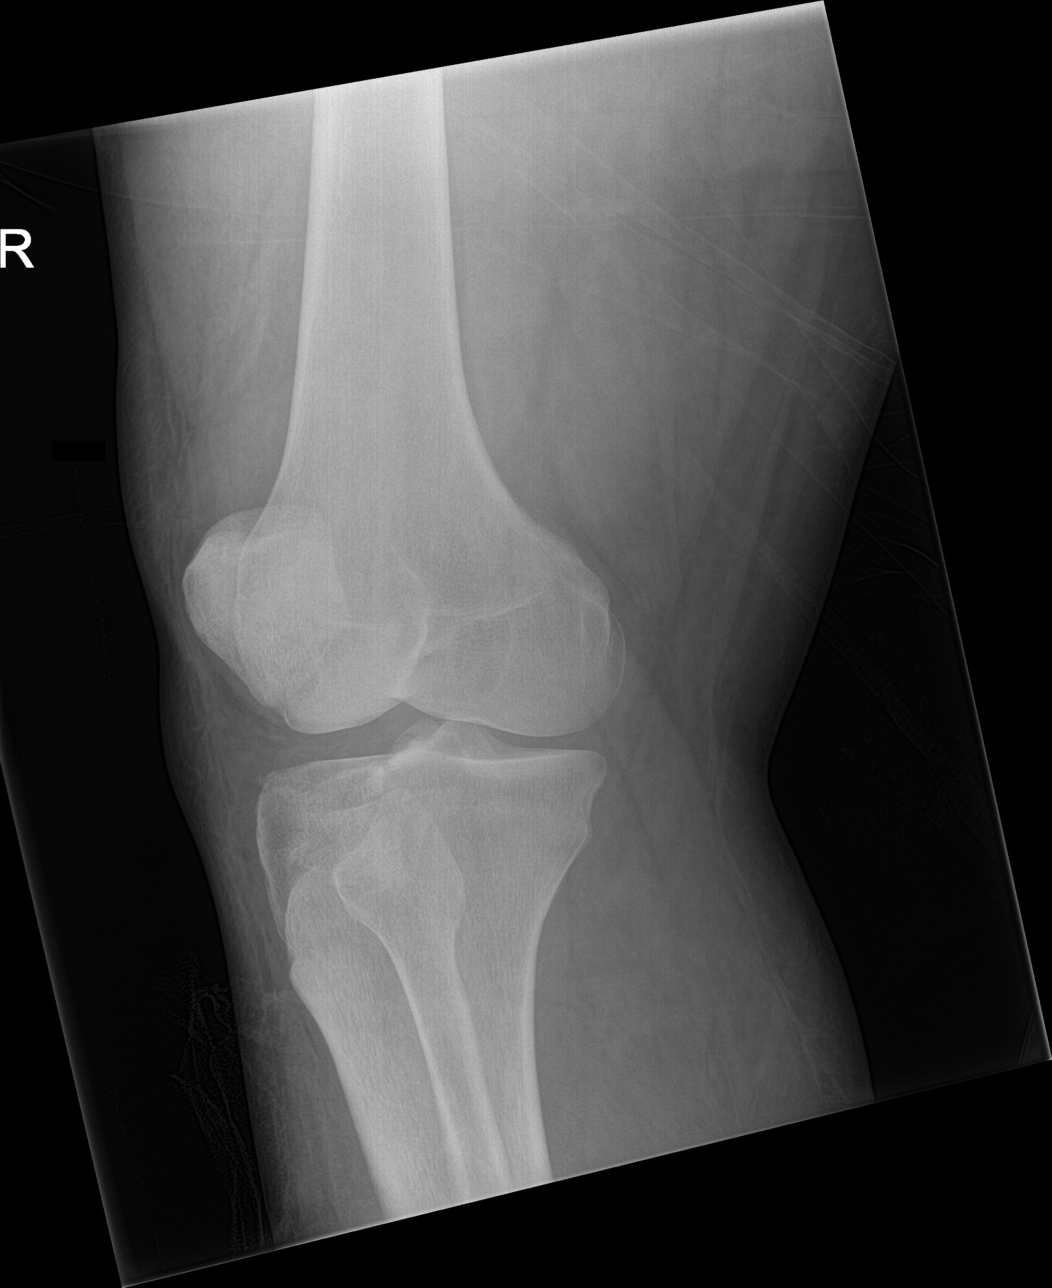

[knee obl (2 of 2)]
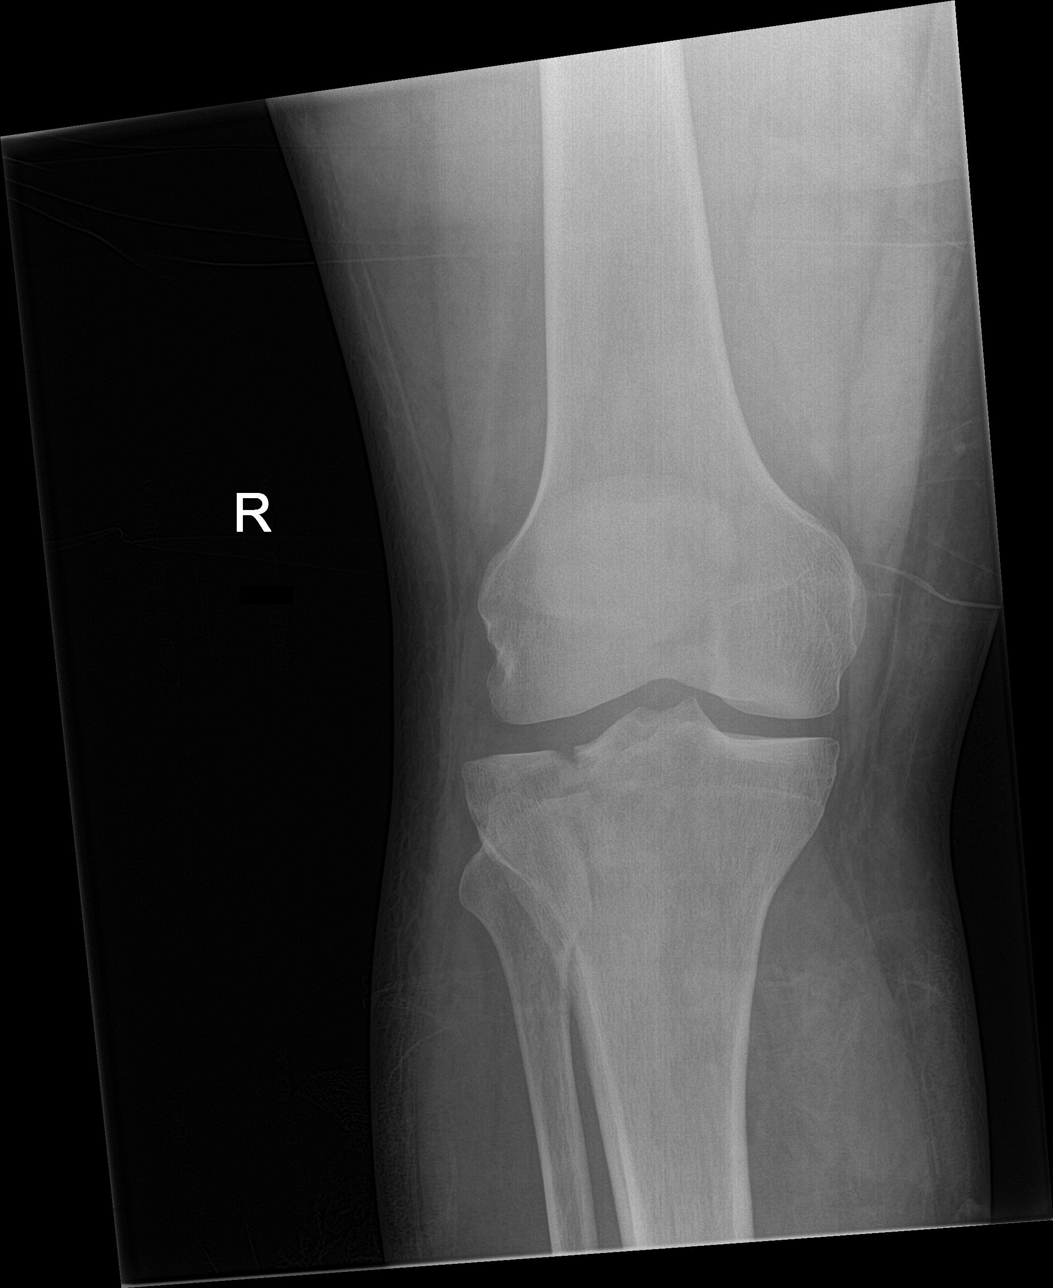

[4 of 4 positions shown; findings below may reference images not displayed]

FINDINGS: There is a lateral tibial plateau fracture. A joint effusion is
noted. No other abnormalities.
IMPRESSION: Lateral tibial plateau fracture with associated joint effusion.
